# Patient Record
Sex: Female | Born: 1955 | Race: White | Hispanic: No | Marital: Married | Smoking: Never smoker
Health system: Southern US, Community
[De-identification: ages and names within clinical notes are randomized; demographics above are authoritative.]

## PROBLEM LIST (undated history)

## (undated) DIAGNOSIS — M1990 Unspecified osteoarthritis, unspecified site: Secondary | ICD-10-CM

## (undated) DIAGNOSIS — R87619 Unspecified abnormal cytological findings in specimens from cervix uteri: Secondary | ICD-10-CM

## (undated) DIAGNOSIS — J45909 Unspecified asthma, uncomplicated: Secondary | ICD-10-CM

## (undated) DIAGNOSIS — I10 Essential (primary) hypertension: Secondary | ICD-10-CM

## (undated) DIAGNOSIS — N979 Female infertility, unspecified: Secondary | ICD-10-CM

## (undated) DIAGNOSIS — IMO0002 Reserved for concepts with insufficient information to code with codable children: Secondary | ICD-10-CM

## (undated) HISTORY — DX: Essential (primary) hypertension: I10

## (undated) HISTORY — DX: Reserved for concepts with insufficient information to code with codable children: IMO0002

## (undated) HISTORY — DX: Female infertility, unspecified: N97.9

## (undated) HISTORY — DX: Unspecified abnormal cytological findings in specimens from cervix uteri: R87.619

## (undated) HISTORY — PX: OTHER SURGICAL HISTORY: SHX169

## (undated) HISTORY — PX: CRYOTHERAPY: SHX1416

## (undated) HISTORY — DX: Unspecified asthma, uncomplicated: J45.909

---

## 2001-07-23 ENCOUNTER — Encounter: Payer: Self-pay | Admitting: Family Medicine

## 2001-07-23 ENCOUNTER — Encounter: Admission: RE | Admit: 2001-07-23 | Discharge: 2001-07-23 | Payer: Self-pay | Admitting: Family Medicine

## 2003-03-14 ENCOUNTER — Encounter: Payer: Self-pay | Admitting: Obstetrics and Gynecology

## 2003-03-14 ENCOUNTER — Encounter: Admission: RE | Admit: 2003-03-14 | Discharge: 2003-03-14 | Payer: Self-pay | Admitting: Obstetrics and Gynecology

## 2004-03-05 ENCOUNTER — Other Ambulatory Visit: Admission: RE | Admit: 2004-03-05 | Discharge: 2004-03-05 | Payer: Self-pay | Admitting: Obstetrics and Gynecology

## 2004-04-12 ENCOUNTER — Ambulatory Visit (HOSPITAL_COMMUNITY): Admission: RE | Admit: 2004-04-12 | Discharge: 2004-04-12 | Payer: Self-pay | Admitting: Obstetrics and Gynecology

## 2005-04-03 ENCOUNTER — Other Ambulatory Visit: Admission: RE | Admit: 2005-04-03 | Discharge: 2005-04-03 | Payer: Self-pay | Admitting: Obstetrics and Gynecology

## 2005-05-12 ENCOUNTER — Ambulatory Visit (HOSPITAL_COMMUNITY): Admission: RE | Admit: 2005-05-12 | Discharge: 2005-05-12 | Payer: Self-pay | Admitting: Obstetrics and Gynecology

## 2006-04-29 ENCOUNTER — Other Ambulatory Visit: Admission: RE | Admit: 2006-04-29 | Discharge: 2006-04-29 | Payer: Self-pay | Admitting: Obstetrics and Gynecology

## 2006-06-16 ENCOUNTER — Ambulatory Visit (HOSPITAL_COMMUNITY): Admission: RE | Admit: 2006-06-16 | Discharge: 2006-06-16 | Payer: Self-pay | Admitting: Obstetrics and Gynecology

## 2006-06-26 ENCOUNTER — Encounter: Admission: RE | Admit: 2006-06-26 | Discharge: 2006-06-26 | Payer: Self-pay | Admitting: Obstetrics and Gynecology

## 2006-12-29 ENCOUNTER — Encounter: Admission: RE | Admit: 2006-12-29 | Discharge: 2006-12-29 | Payer: Self-pay | Admitting: Obstetrics and Gynecology

## 2007-05-18 ENCOUNTER — Other Ambulatory Visit: Admission: RE | Admit: 2007-05-18 | Discharge: 2007-05-18 | Payer: Self-pay | Admitting: Obstetrics and Gynecology

## 2007-08-03 ENCOUNTER — Encounter: Admission: RE | Admit: 2007-08-03 | Discharge: 2007-08-03 | Payer: Self-pay | Admitting: Obstetrics and Gynecology

## 2008-07-14 ENCOUNTER — Other Ambulatory Visit: Admission: RE | Admit: 2008-07-14 | Discharge: 2008-07-14 | Payer: Self-pay | Admitting: Obstetrics and Gynecology

## 2008-08-03 ENCOUNTER — Ambulatory Visit (HOSPITAL_COMMUNITY): Admission: RE | Admit: 2008-08-03 | Discharge: 2008-08-03 | Payer: Self-pay | Admitting: Obstetrics and Gynecology

## 2008-08-08 ENCOUNTER — Encounter: Admission: RE | Admit: 2008-08-08 | Discharge: 2008-08-08 | Payer: Self-pay | Admitting: Obstetrics and Gynecology

## 2009-08-24 ENCOUNTER — Ambulatory Visit (HOSPITAL_COMMUNITY): Admission: RE | Admit: 2009-08-24 | Discharge: 2009-08-24 | Payer: Self-pay | Admitting: Family Medicine

## 2009-09-22 IMAGING — MG MM DIAGNOSTIC LTD LEFT
5 series · 5 of 5 positions shown · non-contrast
Comparison: 08/03/2007, 12/29/2006 (left), 06/26/2006, 06/16/2006

CLINICAL DATA: The patient returns for evaluation of calcifications
in the left breast noted on recent screening study dated
08/03/2008.

[REDACTED] MAMMOGRAM WITH CAD

[L MLO]
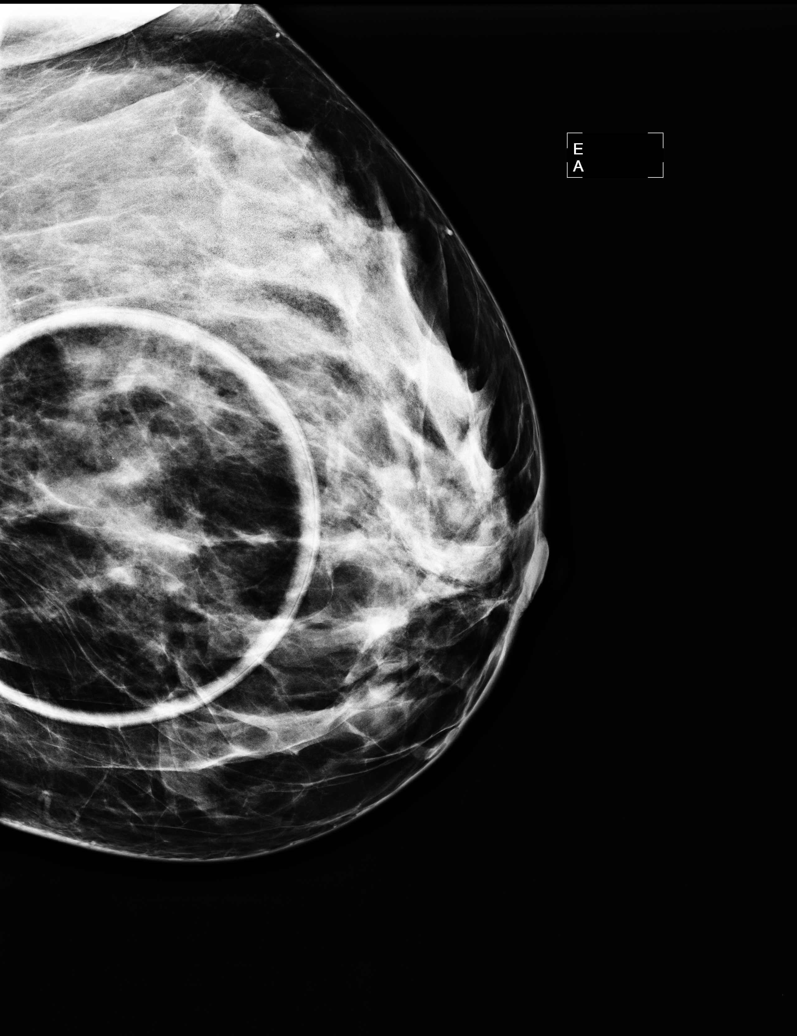

[L ML (1 of 3)]
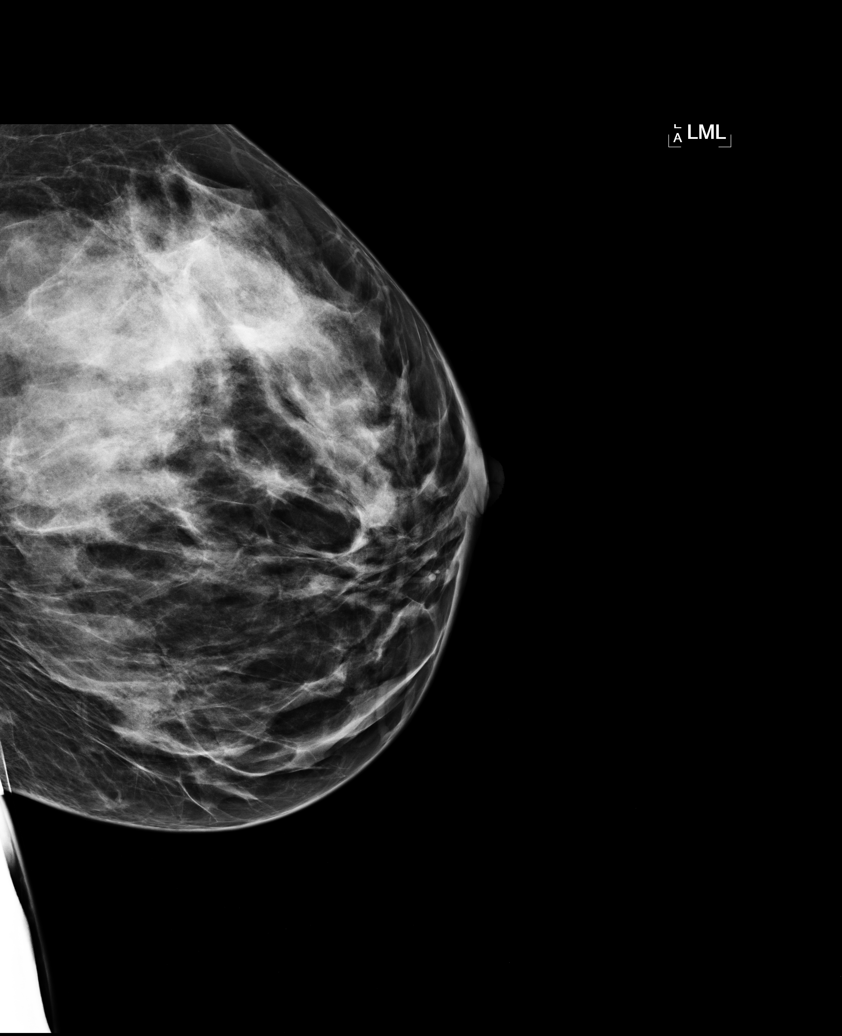

[L CC]
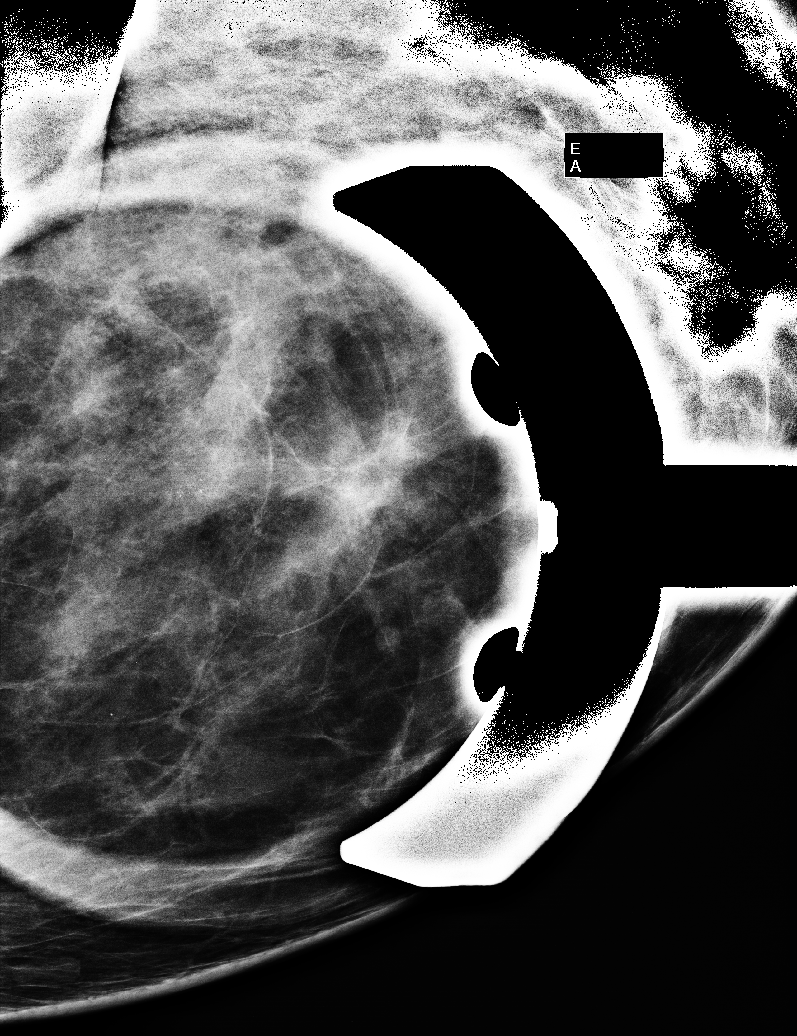

[L ML (2 of 3)]
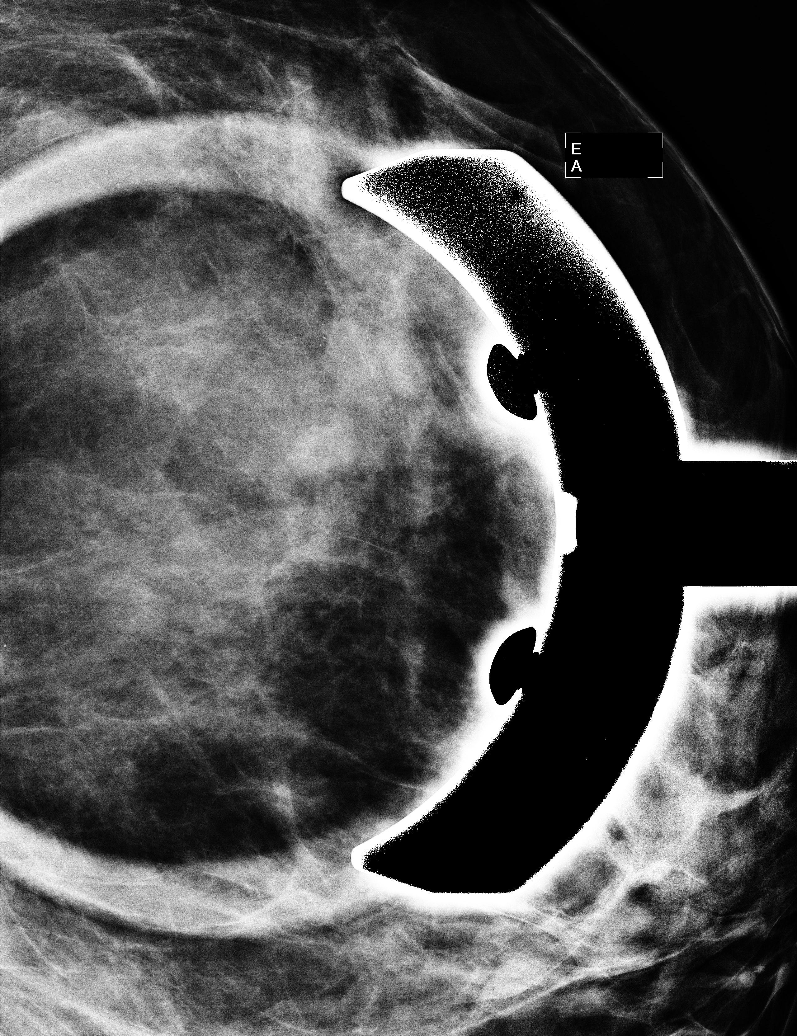

[L ML (3 of 3)]
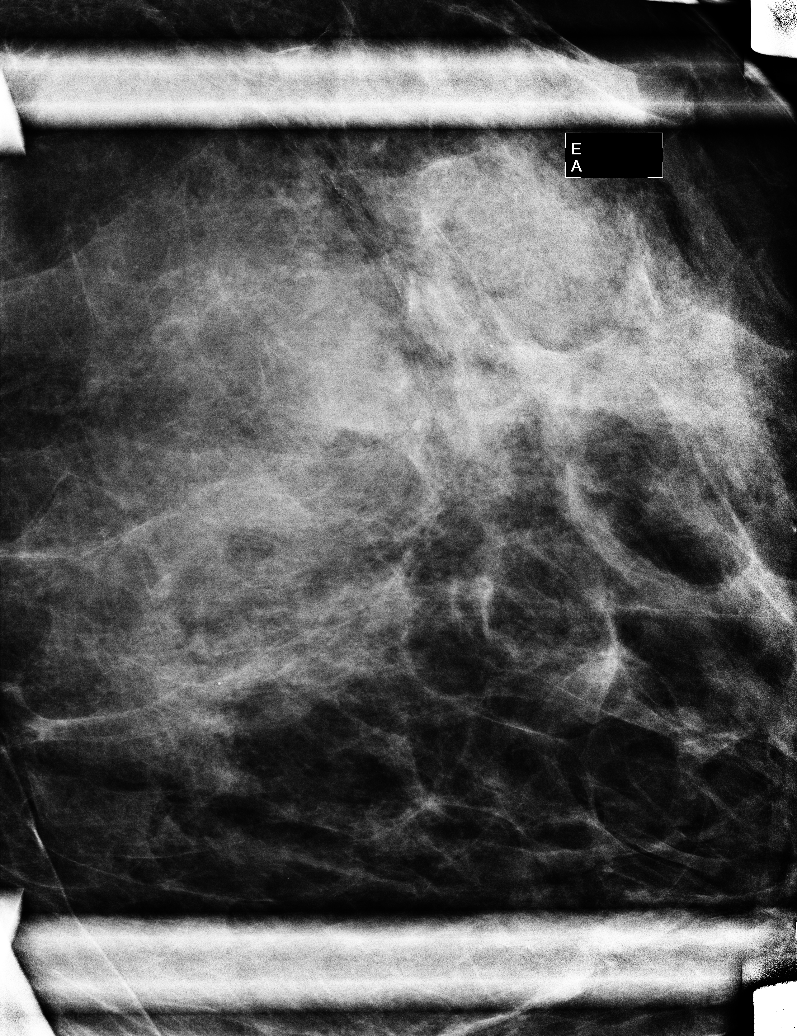

[5 of 5 positions shown; findings below may reference images not displayed]

FINDINGS: Magnification views demonstrate no change in the
punctate calcifications in the left upper inner quadrant.  These
are felt to have a benign appearance.
IMPRESSION: No mammographic evidence of malignancy.  Yearly screening
mammography is suggested.

BI-RADS CATEGORY 2:  Benign finding(s).

## 2010-10-10 ENCOUNTER — Ambulatory Visit (HOSPITAL_COMMUNITY): Admission: RE | Admit: 2010-10-10 | Discharge: 2010-10-10 | Payer: Self-pay | Admitting: Family Medicine

## 2010-10-29 ENCOUNTER — Encounter
Admission: RE | Admit: 2010-10-29 | Discharge: 2010-10-29 | Payer: Self-pay | Source: Home / Self Care | Attending: Family Medicine | Admitting: Family Medicine

## 2010-12-01 ENCOUNTER — Encounter: Payer: Self-pay | Admitting: Family Medicine

## 2010-12-01 ENCOUNTER — Encounter: Payer: Self-pay | Admitting: Obstetrics and Gynecology

## 2012-03-15 ENCOUNTER — Other Ambulatory Visit: Payer: Self-pay | Admitting: Family Medicine

## 2012-03-15 DIAGNOSIS — Z1231 Encounter for screening mammogram for malignant neoplasm of breast: Secondary | ICD-10-CM

## 2012-05-03 ENCOUNTER — Ambulatory Visit
Admission: RE | Admit: 2012-05-03 | Discharge: 2012-05-03 | Disposition: A | Discharge status: Home / Self Care | Payer: BC Managed Care – PPO | Source: Ambulatory Visit | Attending: Family Medicine | Admitting: Family Medicine

## 2012-05-03 DIAGNOSIS — Z1231 Encounter for screening mammogram for malignant neoplasm of breast: Secondary | ICD-10-CM

## 2013-05-18 ENCOUNTER — Encounter: Payer: Self-pay | Admitting: Obstetrics and Gynecology

## 2013-08-22 ENCOUNTER — Encounter: Payer: Self-pay | Admitting: Gynecology

## 2013-09-09 ENCOUNTER — Encounter: Payer: Self-pay | Admitting: Gynecology

## 2013-09-09 ENCOUNTER — Ambulatory Visit (INDEPENDENT_AMBULATORY_CARE_PROVIDER_SITE_OTHER): Payer: BC Managed Care – PPO | Admitting: Gynecology

## 2013-09-09 VITALS — BP 112/68 | HR 60 | Resp 16 | Ht 63.25 in | Wt 162.0 lb

## 2013-09-09 DIAGNOSIS — Z124 Encounter for screening for malignant neoplasm of cervix: Secondary | ICD-10-CM

## 2013-09-09 DIAGNOSIS — Z0000 Encounter for general adult medical examination without abnormal findings: Secondary | ICD-10-CM

## 2013-09-09 DIAGNOSIS — Z01419 Encounter for gynecological examination (general) (routine) without abnormal findings: Secondary | ICD-10-CM

## 2013-09-09 DIAGNOSIS — I10 Essential (primary) hypertension: Secondary | ICD-10-CM | POA: Insufficient documentation

## 2013-09-09 DIAGNOSIS — N920 Excessive and frequent menstruation with regular cycle: Secondary | ICD-10-CM

## 2013-09-09 LAB — POCT URINALYSIS DIPSTICK
Bilirubin, UA: NEGATIVE
Ketones, UA: NEGATIVE
Leukocytes, UA: NEGATIVE
Nitrite, UA: NEGATIVE
Urobilinogen, UA: NEGATIVE

## 2013-09-09 NOTE — Patient Instructions (Signed)

## 2013-09-09 NOTE — Progress Notes (Signed)
57 y.o. married Caucasian female   [id number] here for annual exam. Pt reports menses are not regular.  She does not know report hot flashes, does not have night sweats, does not have vaginal dryness.  She is not using lubricants.  She does not report post-menopasual bleeding.  Pt reports having cycles, irregular for 74m, reports as heavy doubles tampons every 2h for 1-2d, then lasts 2-3d more but lighter.  Pt reports some clots grape size, no post-coital bleeding. Was evaluated with [facility] several years ago, unsure if she had EMB Pt with abnormal PAP 35y ago, treated with cryo.  Patient's last menstrual period was 08/06/2013.          Sexually active: yes  The current method of family planning is vasectomy.    Exercising: yes  yoga Last pap: early 2013 Abnormal PAP: had cryo done 20-25 yrs ago Mammogram: 6/13 BSE: not done Colonoscopy: 2013 DEXA: none Alcohol: 4-5 Tobacco: none  No health maintenance topics applied.  Family History  Problem Relation Age of Onset  . Cancer Mother     lung  . Hypertension Mother   . Diabetes Brother   . Heart disease Brother   . Cancer Other     leg  . Cancer Brother     lung    There are no active problems to display for this patient.   Past Medical History  Diagnosis Date  . Hypertension   . Infertility, female     Past Surgical History  Procedure Laterality Date  . Vaginal wall repair      after delivery    Allergies: Review of patient's allergies indicates no known allergies.  Current Outpatient Prescriptions  Medication Sig Dispense Refill  . lisinopril-hydrochlorothiazide (PRINZIDE,ZESTORETIC) 10-12.5 MG per tablet Take 1 tablet by mouth daily.      . Multiple Vitamins-Minerals (MULTIVITAMIN PO) Take by mouth as needed.       No current facility-administered medications for this visit.    ROS: Pertinent items are noted in HPI.  Exam:    BP 112/68  Pulse 60  Resp 16  Ht 5' 3.25" (1.607 m)  Wt 162 lb (73.483 kg)  BMI 28.45  kg/m2  LMP 08/06/2013 Weight change: @WEIGHTCHANGE @ Last 3 height recordings:  Ht Readings from Last 3 Encounters:  09/09/13 5' 3.25" (1.607 m)   General appearance: alert, cooperative and appears stated age Head: Normocephalic, without obvious abnormality, atraumatic Neck: no adenopathy, no carotid bruit, no JVD, supple, symmetrical, trachea midline and thyroid not enlarged, symmetric, no tenderness/mass/nodules Lungs: clear to auscultation bilaterally Breasts: normal appearance, no masses or tenderness Heart: regular rate and rhythm, S1, S2 normal, no murmur, click, rub or gallop Abdomen: soft, non-tender; bowel sounds normal; no masses,  no organomegaly Extremities: extremities normal, atraumatic, no cyanosis or edema Skin: Skin color, texture, turgor normal. No rashes or lesions Lymph nodes: Cervical, supraclavicular, and axillary nodes normal. no inguinal nodes palpated Neurologic: Grossly normal   Pelvic: External genitalia:  no lesions              Urethra: normal appearing urethra with no masses, tenderness or lesions              Bartholins and Skenes: normal                 Vagina: normal appearing vagina with normal color and discharge, no lesions              Cervix: normal appearance and nabothian cysts, small amount  of blood at os              Pap taken: yes        Bimanual Exam:  Uterus:  uterus is normal size, shape, consistency and nontender                                      Adnexa:    normal adnexa in size, nontender and no masses                                      Rectovaginal: Confirms                                      Anus:  normal sphincter tone, no lesions  A: well woman Peri-menopause with menorrhagia     P: mammogram pap smear with HPV counseled on breast self exam, mammography screening, adequate intake of calcium and vitamin D, diet and exercise return annually or prn Discussed PAP guideline changes, importance of weight bearing exercises,  calcium, vit D and balanced diet.  An After Visit Summary was printed and given to the patient.

## 2013-09-12 ENCOUNTER — Telehealth: Payer: Self-pay | Admitting: Gynecology

## 2013-09-12 ENCOUNTER — Telehealth: Payer: Self-pay | Admitting: *Deleted

## 2013-09-12 NOTE — Telephone Encounter (Signed)
LMTCB to discuss ins benefits and schedule [facility]o Bx.

## 2013-09-12 NOTE — Telephone Encounter (Signed)
Message copied by Lorraine Lax on Mon Sep 12, 2013 12:35 PM ------      Message from: Douglass Rivers      Created: Mon Sep 12, 2013 10:50 AM       Please inform the patient. Labs normal       ------

## 2013-09-12 NOTE — Telephone Encounter (Signed)
Left Message To Call Back  

## 2013-09-14 NOTE — Telephone Encounter (Signed)
LMTCB to schedule PUS.

## 2013-09-14 NOTE — Telephone Encounter (Signed)
Patient notified 09/12/13 (see labs)

## 2013-10-04 ENCOUNTER — Ambulatory Visit (INDEPENDENT_AMBULATORY_CARE_PROVIDER_SITE_OTHER): Payer: BC Managed Care – PPO | Admitting: Gynecology

## 2013-10-04 ENCOUNTER — Encounter: Payer: Self-pay | Admitting: Gynecology

## 2013-10-04 ENCOUNTER — Other Ambulatory Visit: Payer: Self-pay | Admitting: Gynecology

## 2013-10-04 ENCOUNTER — Ambulatory Visit (INDEPENDENT_AMBULATORY_CARE_PROVIDER_SITE_OTHER): Payer: BC Managed Care – PPO

## 2013-10-04 VITALS — BP 122/80 | HR 60 | Resp 12 | Ht 63.25 in | Wt 163.0 lb

## 2013-10-04 DIAGNOSIS — N9489 Other specified conditions associated with female genital organs and menstrual cycle: Secondary | ICD-10-CM

## 2013-10-04 DIAGNOSIS — N840 Polyp of corpus uteri: Secondary | ICD-10-CM

## 2013-10-04 DIAGNOSIS — N920 Excessive and frequent menstruation with regular cycle: Secondary | ICD-10-CM

## 2013-10-04 DIAGNOSIS — R9389 Abnormal findings on diagnostic imaging of other specified body structures: Secondary | ICD-10-CM

## 2013-10-04 NOTE — Progress Notes (Signed)
    Pt here for u/s and sonohysterogram for menorrhagia.  Pt reports having cycles, irregular for 27m, reports as heavy doubles tampons every 2h for 1-2d, then lasts 2-3d more but lighter. Pt reports some clots grape size. U/s images reviewed, retroverted uterus with asymmetrical endometrial stripe noted. Recommend sonohysterogram, consent obtained. Speculum placed, cervix cleansed with betadine, insemination catheter placed and walls gently distended.  endometrial defect noted with feeder vessel on posterior wall-1.2x.7cm and a second smaller defect noted. Reviewed findings. Recommend hysteroscopy with tru clear. Risks of bleeding, perforation, infection were reviewed.  Possible DVT formation and subsequent PE discussed.  Possible dx laparoscopy if perforation occurs discussed. Expect that surgery will be both diagnostic and therapeutic re her bleeding. Questions addressed Pt is comfortable with the decision to treat.   BP 122/80  Pulse 60  Resp 12  Ht 5' 3.25" (1.607 m)  Wt 163 lb (73.936 kg)  BMI 28.63 kg/m2  LMP 09/10/2013 General appearance: alert, cooperative and appears stated age Lungs: clear to auscultation bilaterally Heart: regular rate and rhythm, S1, S2 normal, no murmur, click, rub or gallop Abdomen: soft nontender Pelvic exam: normal external genitalia, vulva, vagina, cervix, uterus and adnexa, retroverted uterus.  62m spent discussing treatment for menorrhagia and endometrial polyps

## 2013-10-05 ENCOUNTER — Telehealth: Payer: Self-pay | Admitting: *Deleted

## 2013-10-05 NOTE — Telephone Encounter (Signed)
Call to patient regarding possible surgery date.  Advised only December option would be 11-01-13.  Patient states this date will not work for her.  Would prefer to look at [facility], poss 11-16-12 but will check her school schedule and call me back next week.  Routing to provider for final review. Patient agreeable to disposition. Will close encounter

## 2013-10-12 NOTE — Telephone Encounter (Signed)
Patient is returning a call to carolynn.

## 2013-10-12 NOTE — Telephone Encounter (Signed)
Spoke with patient in regards to her insurance benefits for surgery. Patient asked if I would e-mail her the information so she can see it in front of her. She will call back within 24 hours with her decision. She is currently still available on 11/16/13.

## 2013-10-12 NOTE — Telephone Encounter (Signed)
LMTCB to discuss insurance benefits for surgery.  °

## 2013-10-14 NOTE — Telephone Encounter (Signed)
Patient is asking to talk with Carolynn.

## 2013-10-19 NOTE — Telephone Encounter (Signed)
Patient called to check on status of having surgery scheduled. Advised that Candace [name] was unavailable but I would relay the message.

## 2013-10-20 NOTE — Telephone Encounter (Signed)
Surgery scheduled for 11-16-13 at 0900 at [facility].  Call to patient, LMTCB.

## 2013-10-26 NOTE — Telephone Encounter (Signed)
Patient notified of surgery date of 11-16-13 and preop instructions given and will mail copy.  Pre/post op appt scheduled. Brief discussion on 2-3 days off work. Patient states she does not need a note. Inst to call prn.  Routing to provider for final review. Patient agreeable to disposition. Will close encounter

## 2013-10-31 ENCOUNTER — Encounter (HOSPITAL_COMMUNITY): Payer: Self-pay | Admitting: Pharmacist

## 2013-11-01 ENCOUNTER — Encounter: Payer: Self-pay | Admitting: Gynecology

## 2013-11-01 ENCOUNTER — Ambulatory Visit (INDEPENDENT_AMBULATORY_CARE_PROVIDER_SITE_OTHER): Payer: BC Managed Care – PPO | Admitting: Gynecology

## 2013-11-01 VITALS — BP 104/78 | HR 80 | Resp 12 | Ht 63.25 in | Wt 163.0 lb

## 2013-11-01 DIAGNOSIS — N840 Polyp of corpus uteri: Secondary | ICD-10-CM

## 2013-11-01 DIAGNOSIS — N920 Excessive and frequent menstruation with regular cycle: Secondary | ICD-10-CM

## 2013-11-01 MED ORDER — MISOPROSTOL 200 MCG PO TABS
ORAL_TABLET | ORAL | Status: DC
Start: 2013-11-01 — End: 2013-11-16

## 2013-11-01 NOTE — Progress Notes (Signed)
  57 y.o.MarriedNot Hispanic or Latinofemale presents for preoperative consult for D&C hysteroscopy with tru clear formenorrhagia, metrorrhagia and endometrial polyps diagnosed by [facility] 10/04/13- 12mm in left fundal area and a second one on right.  Pt is without complaints today   Pertinent items are noted in HPI.  BP 104/78  Pulse 80  Resp 12  Ht 5' 3.25" (1.607 m)  Wt 163 lb (73.936 kg)  BMI 28.63 kg/m2  LMP 09/10/2013 General appearance: alert, cooperative and appears stated age Lungs: clear to auscultation bilaterally Heart: regular rate and rhythm, S1, S2 normal, no murmur, click, rub or gallop Abdomen: soft, non-tender; bowel sounds normal; no masses,  no organomegaly Pelvic: cervix normal in appearance, external genitalia normal, no adnexal masses or tenderness, no cervical motion tenderness, uterus normal size, shape, and consistency, vagina normal without discharge and anteverted Extremities: no edema  The procedure was discussed at length. Pt informed regarding risks and benefits of surgery, including but not limited to bleeding, infections, damage to bowel or bladder due to uterine perforation either during the procedure or during dilation. Nerve injury was also discussed related to positioning. A medicationwill be be given preoperatively to soften the cervix. Pt aware of posssible laparoscopy if uterine perforation occurs  There is a risk of formation of a deep vein thrombus in the extremities was discussed, although PAS will be placed to minimize the risk, the patient was informed that a pulmonary embolism could still form and could result in death.  She was instructed on signs and symptoms to be aware of and the need to call if they should develop.  Fluid overload from the distending media was discussed as was the usual intra-operative safety precautions in place. Pt aware distending media is normal saline.    All questions were addressed.   Pt ready for surgery  Length of  consult 25m, >50% face to face 

## 2013-11-14 ENCOUNTER — Encounter (HOSPITAL_COMMUNITY): Payer: Self-pay

## 2013-11-14 ENCOUNTER — Encounter (HOSPITAL_COMMUNITY)
Admission: RE | Admit: 2013-11-14 | Discharge: 2013-11-14 | Disposition: A | Discharge status: Home / Self Care | Payer: BC Managed Care – PPO | Source: Ambulatory Visit | Attending: Gynecology | Admitting: Gynecology

## 2013-11-14 HISTORY — DX: Unspecified osteoarthritis, unspecified site: M19.90

## 2013-11-14 LAB — CBC
HEMATOCRIT: 37.9 % (ref 36.0–46.0)
HEMOGLOBIN: 13 g/dL (ref 12.0–15.0)
MCH: 31.6 pg (ref 26.0–34.0)
MCHC: 34.3 g/dL (ref 30.0–36.0)
MCV: 92.2 fL (ref 78.0–100.0)
Platelets: 270 10*3/uL (ref 150–400)
RBC: 4.11 MIL/uL (ref 3.87–5.11)
RDW: 12.3 % (ref 11.5–15.5)
WBC: 5.2 10*3/uL (ref 4.0–10.5)

## 2013-11-14 LAB — COMPREHENSIVE METABOLIC PANEL
ALT: 9 U/L (ref 0–35)
AST: 19 U/L (ref 0–37)
Albumin: 4 g/dL (ref 3.5–5.2)
Alkaline Phosphatase: 68 U/L (ref 39–117)
BUN: 18 mg/dL (ref 6–23)
CALCIUM: 9.3 mg/dL (ref 8.4–10.5)
CO2: 26 meq/L (ref 19–32)
CREATININE: 0.72 mg/dL (ref 0.50–1.10)
Chloride: 98 mEq/L (ref 96–112)
GFR calc non Af Amer: >90 mL/min (ref >90)
Glucose, Bld: 89 mg/dL (ref 70–99)
POTASSIUM: 3.7 meq/L (ref 3.7–5.3)
SODIUM: 137 meq/L (ref 137–147)
Total Bilirubin: <0.2 mg/dL — ABNORMAL LOW (ref 0.3–1.2)
Total Protein: 7.8 g/dL (ref 6.0–8.3)

## 2013-11-14 NOTE — Patient Instructions (Addendum)
   Your procedure is scheduled on: Wed, Jan 7  [facility] 8 AM Pick up the phone at the desk and dial [phone] and inform us of your arrival.  Please call this number if you have any problems the morning of surgery: [phone]  Remember: Do not eat or drink after midnight: Tuesday Take these medicines the morning of surgery with a SIP OF WATER: lisinopril-hydrochlorothiazide.  Patient instructed to take cytotec on Tuesday night as instructed by MD   Do not wear jewelry, make-up, or FINGER nail polish No metal in your hair or on your body. Do not wear lotions, powders, perfumes. You may wear deodorant.  Do not bring valuables to the hospital. Contacts, dentures or bridgework may not be worn into surgery.  Patients discharged on the day of surgery will not be allowed to drive home.  Home with Home with husband Scott cell [phone].

## 2013-11-16 ENCOUNTER — Ambulatory Visit (HOSPITAL_COMMUNITY)
Admission: RE | Admit: 2013-11-16 | Discharge: 2013-11-16 | Disposition: A | Discharge status: Home / Self Care | Payer: BC Managed Care – PPO | Source: Ambulatory Visit | Attending: Gynecology | Admitting: Gynecology

## 2013-11-16 ENCOUNTER — Encounter (HOSPITAL_COMMUNITY)
Admission: RE | Disposition: A | Discharge status: Home / Self Care | Payer: Self-pay | Source: Ambulatory Visit | Attending: Gynecology

## 2013-11-16 ENCOUNTER — Ambulatory Visit (HOSPITAL_COMMUNITY): Payer: BC Managed Care – PPO | Admitting: Anesthesiology

## 2013-11-16 ENCOUNTER — Encounter (HOSPITAL_COMMUNITY): Payer: BC Managed Care – PPO | Admitting: Anesthesiology

## 2013-11-16 ENCOUNTER — Encounter (HOSPITAL_COMMUNITY): Payer: Self-pay | Admitting: Anesthesiology

## 2013-11-16 DIAGNOSIS — N920 Excessive and frequent menstruation with regular cycle: Secondary | ICD-10-CM | POA: Insufficient documentation

## 2013-11-16 DIAGNOSIS — I10 Essential (primary) hypertension: Secondary | ICD-10-CM

## 2013-11-16 DIAGNOSIS — Z9889 Other specified postprocedural states: Secondary | ICD-10-CM

## 2013-11-16 DIAGNOSIS — N921 Excessive and frequent menstruation with irregular cycle: Secondary | ICD-10-CM | POA: Insufficient documentation

## 2013-11-16 DIAGNOSIS — N840 Polyp of corpus uteri: Secondary | ICD-10-CM | POA: Insufficient documentation

## 2013-11-16 HISTORY — PX: DILATATION & CURETTAGE/HYSTEROSCOPY WITH TRUECLEAR: SHX6353

## 2013-11-16 SURGERY — DILATATION & CURETTAGE/HYSTEROSCOPY WITH TRUCLEAR
Anesthesia: General | Site: Uterus

## 2013-11-16 MED ORDER — ONDANSETRON HCL 4 MG/2ML IJ SOLN: 4.0000 mg | Freq: Once | INTRAMUSCULAR | Status: DC | PRN

## 2013-11-16 MED ORDER — BUPIVACAINE-EPINEPHRINE 0.25% -1:200000 IJ SOLN
INTRAMUSCULAR | Status: DC | PRN
  Administered 2013-11-16: 6 mL

## 2013-11-16 MED ORDER — LIDOCAINE HCL (CARDIAC) 20 MG/ML IV SOLN
INTRAVENOUS | Status: AC
  Filled 2013-11-16: qty 5

## 2013-11-16 MED ORDER — LIDOCAINE-EPINEPHRINE 2 %-1:100000 IJ SOLN
INTRAMUSCULAR | Status: AC
  Filled 2013-11-16: qty 1

## 2013-11-16 MED ORDER — ONDANSETRON HCL 4 MG/2ML IJ SOLN
INTRAMUSCULAR | Status: AC
  Filled 2013-11-16: qty 2

## 2013-11-16 MED ORDER — BUPIVACAINE-EPINEPHRINE PF 0.25-1:200000 % IJ SOLN
INTRAMUSCULAR | Status: AC
  Filled 2013-11-16: qty 30

## 2013-11-16 MED ORDER — LIDOCAINE HCL 2 % IJ SOLN
INTRAMUSCULAR | Status: DC | PRN
  Administered 2013-11-16: 6 mL

## 2013-11-16 MED ORDER — LIDOCAINE HCL 2 % IJ SOLN
INTRAMUSCULAR | Status: AC
  Filled 2013-11-16: qty 20

## 2013-11-16 MED ORDER — MIDAZOLAM HCL 2 MG/2ML IJ SOLN
INTRAMUSCULAR | Status: AC
  Filled 2013-11-16: qty 2

## 2013-11-16 MED ORDER — KETOROLAC TROMETHAMINE 30 MG/ML IJ SOLN: 15.0000 mg | Freq: Once | INTRAMUSCULAR | Status: DC | PRN

## 2013-11-16 MED ORDER — FENTANYL CITRATE 0.05 MG/ML IJ SOLN
INTRAMUSCULAR | Status: AC
  Filled 2013-11-16: qty 5

## 2013-11-16 MED ORDER — LIDOCAINE HCL 2 % EX GEL
CUTANEOUS | Status: AC
  Filled 2013-11-16: qty 5

## 2013-11-16 MED ORDER — FENTANYL CITRATE 0.05 MG/ML IJ SOLN
INTRAMUSCULAR | Status: DC | PRN
  Administered 2013-11-16 (×2): 50 ug via INTRAVENOUS

## 2013-11-16 MED ORDER — KETOROLAC TROMETHAMINE 30 MG/ML IJ SOLN
INTRAMUSCULAR | Status: DC | PRN
  Administered 2013-11-16: 30 mg via INTRAVENOUS

## 2013-11-16 MED ORDER — PROPOFOL 10 MG/ML IV EMUL
INTRAVENOUS | Status: AC
  Filled 2013-11-16: qty 20

## 2013-11-16 MED ORDER — MEPERIDINE HCL 25 MG/ML IJ SOLN: 6.2500 mg | INTRAMUSCULAR | Status: DC | PRN

## 2013-11-16 MED ORDER — MIDAZOLAM HCL 2 MG/2ML IJ SOLN
INTRAMUSCULAR | Status: DC | PRN
  Administered 2013-11-16: 2 mg via INTRAVENOUS

## 2013-11-16 MED ORDER — LACTATED RINGERS IV SOLN
INTRAVENOUS | Status: DC | PRN
  Administered 2013-11-16 (×2): via INTRAVENOUS

## 2013-11-16 MED ORDER — ONDANSETRON HCL 4 MG/2ML IJ SOLN
INTRAMUSCULAR | Status: DC | PRN
Start: 2013-11-16 — End: 2013-11-16
  Administered 2013-11-16: 4 mg via INTRAVENOUS

## 2013-11-16 MED ORDER — PHENYLEPHRINE HCL 10 MG/ML IJ SOLN
INTRAMUSCULAR | Status: DC | PRN
  Administered 2013-11-16 (×5): 40 ug via INTRAVENOUS

## 2013-11-16 MED ORDER — KETOROLAC TROMETHAMINE 30 MG/ML IJ SOLN
INTRAMUSCULAR | Status: AC
  Filled 2013-11-16: qty 1

## 2013-11-16 MED ORDER — DEXAMETHASONE SODIUM PHOSPHATE 10 MG/ML IJ SOLN
INTRAMUSCULAR | Status: AC
  Filled 2013-11-16: qty 1

## 2013-11-16 MED ORDER — LACTATED RINGERS IV SOLN
INTRAVENOUS | Status: DC
  Administered 2013-11-16: 09:00:00 via INTRAVENOUS

## 2013-11-16 MED ORDER — FENTANYL CITRATE 0.05 MG/ML IJ SOLN: 25.0000 ug | INTRAMUSCULAR | Status: DC | PRN

## 2013-11-16 MED ORDER — DEXAMETHASONE SODIUM PHOSPHATE 4 MG/ML IJ SOLN
INTRAMUSCULAR | Status: DC | PRN
  Administered 2013-11-16: 10 mg via INTRAVENOUS

## 2013-11-16 MED ORDER — LIDOCAINE HCL (CARDIAC) 20 MG/ML IV SOLN
INTRAVENOUS | Status: DC | PRN
  Administered 2013-11-16: 50 mg via INTRAVENOUS

## 2013-11-16 MED ORDER — PROPOFOL 10 MG/ML IV BOLUS
INTRAVENOUS | Status: DC | PRN
  Administered 2013-11-16: 160 mg via INTRAVENOUS

## 2013-11-16 SURGICAL SUPPLY — 21 items
BLADE INCISOR TRUC PLUS 2.9 (ABLATOR) IMPLANT
CANISTERS HI-FLOW 3000CC (CANNISTER) ×4 IMPLANT
CATH ROBINSON RED A/P 16FR (CATHETERS) ×2 IMPLANT
CONTAINER PREFILL 10% NBF 60ML (FORM) ×4 IMPLANT
DRAPE HYSTEROSCOPY (DRAPE) IMPLANT
DRSG TELFA 3X8 NADH (GAUZE/BANDAGES/DRESSINGS) ×2 IMPLANT
GLOVE BIOGEL M 6.5 STRL (GLOVE) ×2 IMPLANT
GLOVE BIOGEL PI IND STRL 6.5 (GLOVE) ×1 IMPLANT
GLOVE BIOGEL PI INDICATOR 6.5 (GLOVE) ×1
GOWN STRL REIN XL XLG (GOWN DISPOSABLE) ×4 IMPLANT
INCISOR TRUC PLUS BLADE 2.9 (ABLATOR) ×2
KIT HYSTEROSCOPY TRUCLEAR (ABLATOR) IMPLANT
MORCELLATOR RECIP TRUCLEAR 4.0 (ABLATOR) IMPLANT
NDL SPNL 22GX3.5 QUINCKE BK (NEEDLE) ×1 IMPLANT
NEEDLE SPNL 22GX3.5 QUINCKE BK (NEEDLE) ×2 IMPLANT
PACK VAGINAL MINOR WOMEN LF (CUSTOM PROCEDURE TRAY) ×2 IMPLANT
PAD DRESSING TELFA 3X8 NADH (GAUZE/BANDAGES/DRESSINGS) ×1 IMPLANT
PAD OB MATERNITY 4.3X12.25 (PERSONAL CARE ITEMS) ×2 IMPLANT
SYRINGE 10CC LL (SYRINGE) ×2 IMPLANT
TOWEL OR 17X24 6PK STRL BLUE (TOWEL DISPOSABLE) ×4 IMPLANT
WATER STERILE IRR 1000ML POUR (IV SOLUTION) ×2 IMPLANT

## 2013-11-16 NOTE — Anesthesia Postprocedure Evaluation (Signed)
Anesthesia Post Note  Patient: Candace HinesElizabeth A Galambos  Procedure(s) Performed: Procedure(s) (LRB): DILATATION & CURETTAGE/HYSTEROSCOPY WITH TRUCLEAR (N/A)  Anesthesia type: General  Patient location: PACU  Post pain: Pain level controlled  Post assessment: Post-op Vital signs reviewed  Last Vitals:  Filed Vitals:   11/16/13 1045  BP: 104/62  Pulse: 64  Temp:   Resp: 16    Post vital signs: Reviewed  Level of consciousness: sedated  Complications: No apparent anesthesia complications

## 2013-11-16 NOTE — Transfer of Care (Signed)
Immediate Anesthesia Transfer of Care Note  Patient: Candace HinesElizabeth A Mergenthaler  Procedure(s) Performed: Procedure(s): DILATATION & CURETTAGE/HYSTEROSCOPY WITH TRUCLEAR (N/A)  Patient Location: PACU  Anesthesia Type:General  Level of Consciousness: awake, alert  and patient cooperative  Airway & Oxygen Therapy: Patient Spontanous Breathing and Patient connected to nasal cannula oxygen  Post-op Assessment: Report given to PACU RN and Post -op Vital signs reviewed and stable  Post vital signs: Reviewed and stable  Complications: No apparent anesthesia complications

## 2013-11-16 NOTE — H&P (View-Only) (Signed)
  58 y.o.MarriedNot Hispanic or Latinofemale [facility] clear formenorrhagia, metrorrhagia and [facility]- 12mm in left fundal area and a second one on right.  Pt is without complaints today   Pertinent items are noted in HPI.  BP 104/78  Pulse 80  Resp 12  Ht 5' 3.25" (1.607 m)  Wt 163 lb (73.936 kg)  BMI 28.63 kg/m2  LMP 09/10/2013 General appearance: alert, cooperative and appears stated age Lungs: clear to auscultation bilaterally Heart: regular rate and rhythm, S1, S2 normal, no murmur, click, rub or gallop Abdomen: soft, non-tender; bowel sounds normal; no masses,  no organomegaly Pelvic: cervix normal in appearance, external genitalia normal, no adnexal masses or tenderness, no cervical motion tenderness, uterus normal size, shape, and consistency, vagina normal without discharge and anteverted Extremities: no edema  The procedure was discussed at length. Pt informed regarding risks and benefits of surgery, including but not limited to bleeding, infections, damage to bowel or bladder due to uterine perforation either during the procedure or during dilation. Nerve injury was also discussed related to positioning. A medicationwill be be given preoperatively to soften the cervix. Pt aware of posssible laparoscopy if uterine perforation occurs  There is a risk of formation of a deep vein thrombus in the extremities was discussed, although PAS will be placed to minimize the risk, the patient was informed that a pulmonary embolism could still form and could result in death.  She was instructed on signs and symptoms to be aware of and the need to call if they should develop.  Fluid overload from the distending media was discussed as was the usual intra-operative safety precautions in place. Pt aware distending media is normal saline.    All questions were addressed.   Pt ready for surgery  Length of  consult 2568m, >50% face to face

## 2013-11-16 NOTE — Discharge Instructions (Signed)

## 2013-11-16 NOTE — Brief Op Note (Signed)
11/16/2013  10:26 AM  PATIENT:  Candace HinesElizabeth A [name]  58 y.o. female  PRE-OPERATIVE DIAGNOSIS:  menorrhagia, endometrial polyp  POST-OPERATIVE DIAGNOSIS:  menorrhagia, endometrial polyp  PROCEDURE:  Procedure(s): DILATATION & CURETTAGE/HYSTEROSCOPY WITH TRUCLEAR (N/A)  SURGEON:  Surgeon(s) and Role:    * Bennye Almracy H Andrius Andrepont, MD - Primary  PHYSICIAN ASSISTANT:   ASSISTANTS: none   ANESTHESIA:   MAC  EBL:  Total I/O In: 1300 [I.V.:1300] Out: 50 [Urine:40; Blood:10]  BLOOD ADMINISTERED:none  DRAINS: none   LOCAL MEDICATIONS USED:  0.25% MARCAINE   , 2%LIDOCAINE  and Amount: 13 ml  SPECIMEN:  Source of Specimen:  uterus  DISPOSITION OF SPECIMEN:  PATHOLOGY  COUNTS:  YES  TOURNIQUET:  * No tourniquets in log *  DICTATION: .Other Dictation: Dictation Number [id number]  PLAN OF CARE: Discharge to home after PACU  PATIENT DISPOSITION:  PACU - hemodynamically stable.   Delay start of Pharmacological VTE agent (>24hrs) due to surgical blood loss or risk of bleeding: not applicable

## 2013-11-16 NOTE — Anesthesia Preprocedure Evaluation (Signed)
Anesthesia Evaluation  Patient identified by MRN, date of birth, ID band Patient awake    Reviewed: Allergy & Precautions, H&P , NPO status , Patient's Chart, lab work & pertinent test results  Airway Mallampati: I TM Distance: >3 FB Neck ROM: full    Dental no notable dental hx. (+) Teeth Intact   Pulmonary neg pulmonary ROS,    Pulmonary exam normal       Cardiovascular hypertension, Pt. on medications     Neuro/Psych negative neurological ROS  negative psych ROS   GI/Hepatic negative GI ROS, Neg liver ROS,   Endo/Other  negative endocrine ROS  Renal/GU negative Renal ROS  negative genitourinary   Musculoskeletal negative musculoskeletal ROS (+)   Abdominal Normal abdominal exam  (+)   Peds  Hematology negative hematology ROS (+)   Anesthesia Other Findings   Reproductive/Obstetrics negative OB ROS                           Anesthesia Physical Anesthesia Plan  ASA: II  Anesthesia Plan: General   Post-op Pain Management:    Induction: Intravenous  Airway Management Planned: LMA  Additional Equipment:   Intra-op Plan:   Post-operative Plan:   Informed Consent: I have reviewed the patients History and Physical, chart, labs and discussed the procedure including the risks, benefits and alternatives for the proposed anesthesia with the patient or authorized representative who has indicated his/her understanding and acceptance.     Plan Discussed with: CRNA and Surgeon  Anesthesia Plan Comments:         Anesthesia Quick Evaluation

## 2013-11-16 NOTE — Interval H&P Note (Signed)
History and Physical Interval Note:  11/16/2013 8:52 AM  Sammuel HinesElizabeth A [name]  has presented today for surgery, with the diagnosis of menorrhagia, endometrial polyp  The various methods of treatment have been discussed with the patient and family. After consideration of risks, benefits and other options for treatment, the patient has consented to  Procedure(s): DILATATION & CURETTAGE/HYSTEROSCOPY WITH TRUCLEAR (N/A) as a surgical intervention .  The patient's history has been reviewed, patient examined, no change in status, stable for surgery.  I have reviewed the patient's chart and labs.  Questions were answered to the patient's satisfaction.     Jayvion Stefanski H

## 2013-11-17 ENCOUNTER — Encounter (HOSPITAL_COMMUNITY): Payer: Self-pay | Admitting: Gynecology

## 2013-11-17 NOTE — Op Note (Signed)
NAMMargarette Asal:  [name], Candace [name]             ACCOUNT NO.:  [id number]  MEDICAL RECORD NO.:  [id number]  LOCATION:  [facility]                          FACILITY:  [facility]  PHYSICIAN:  Ivor Costaracy H. Farrel GobbleLathrop, M.D. DATE OF BIRTH:  May 23, 1956  DATE OF PROCEDURE:  11/16/2013 DATE OF DISCHARGE:                              OPERATIVE REPORT   PREOPERATIVE DIAGNOSES: 1. Menometrorrhagia. 2. Endometrial polyp.  POSTOPERATIVE DIAGNOSES: 1. Menometrorrhagia. 2. Endometrial polyp.  PROCEDURE:  D and C hysteroscopy with TRUCLEAR.  SURGEON:  Ivor Costaracy H. Farrel GobbleLathrop, M.D.  ANESTHESIA:  MAC.  IV FLUIDS:  1300 mL of lactated Ringer's.  URINE OUTPUT:  40 mL.  ESTIMATED BLOOD LOSS:  Minimal.  I and O deficit is approximately 100 mL of normal saline.  PATHOLOGY:  Endometrial polyps and curettings.  INDICATIONS:  The patient is a 58 year old perimenopausal woman with menometrorrhagia who was noted on sonohysterogram to have two endometrial defects that were felt to be polyps.  One was approximately 1.5 cm in the left cornual area and a small polyp that was noted.  FINDINGS:  The uterus sounded to 7.  There was a large polyp arising from the left cornua and the smaller one in the mid fundal area.  The remainder of the cavity was unremarkable as well as the cervix.  PROCEDURE:  The patient was taken to the operating room, placed in a dorsal lithotomy position.  Prepped and draped in usual sterile fashion. Bimanual exam was performed.  Orientation of the uterus was confirmed. A sterile weighted speculum was then placed in the vagina.  The cervix was visualized and stabilized with a single-tooth tenaculum.  A paracervical block of equal parts 2% lidocaine and 0.25% Marcaine was placed for a total of 13 mL.  The cervix was noted to be dilated from Cytotec taken the evening before and the uterus sounded to 7.  A #19- [place] dilator was then advanced through the cervix without any resistance.  The TRUCLEAR was then  prepared.  The hysteroscope was advanced through the cervix and into the cavity and the findings were above.  TRUCLEAR blade was then advanced through the scope.  The plate was locked in place and then using the device, the polyp in the left cornual area was resected in its entirety.  Similarly, the fundal polyp and another smaller defect noted on the posterior wall were removed. The hysteroscope was then removed.  Sharp curettage was performed and sent as a separate specimen.  The patient tolerated the procedure well. Sponge, lab, and needle counts were correct x2.  She was transferred to the recovery room in stable condition.     Ivor Costaracy H. Farrel GobbleLathrop, M.D.     THL/MEDQ  D:  11/16/2013  T:  11/16/2013  Job:  [id number]

## 2013-11-21 ENCOUNTER — Telehealth: Payer: Self-pay | Admitting: *Deleted

## 2013-11-21 NOTE — Telephone Encounter (Signed)
Message copied by Alisa GraffYEAKLEY, Jun Osment on Mon Nov 21, 2013  1:49 PM ------      Message from: Douglass RiversLATHROP, TRACY      Created: Fri Nov 18, 2013  8:23 AM       Inform benign polyp ------

## 2013-11-28 NOTE — Telephone Encounter (Signed)
Patient returned call, notified of path report per Dr Liliana ClineLathrop's instruction. Has post op appt scheduled for 12-05-13.

## 2013-11-28 NOTE — Telephone Encounter (Signed)
Message copied by Alisa GraffYEAKLEY, SARAH on Mon Nov 28, 2013  2:18 PM ------      Message from: Douglass RiversLATHROP, TRACY      Created: Fri Nov 18, 2013  8:23 AM       Inform benign polyp ------

## 2013-11-28 NOTE — Telephone Encounter (Signed)
LMTCB home and cell number. LMTCB.

## 2013-12-05 ENCOUNTER — Encounter: Payer: Self-pay | Admitting: Gynecology

## 2013-12-05 ENCOUNTER — Ambulatory Visit (INDEPENDENT_AMBULATORY_CARE_PROVIDER_SITE_OTHER): Payer: BC Managed Care – PPO | Admitting: Gynecology

## 2013-12-05 VITALS — BP 123/94 | HR 67 | Resp 14 | Ht 63.25 in | Wt 164.0 lb

## 2013-12-05 DIAGNOSIS — N840 Polyp of corpus uteri: Secondary | ICD-10-CM

## 2013-12-05 DIAGNOSIS — N920 Excessive and frequent menstruation with regular cycle: Secondary | ICD-10-CM

## 2013-12-05 DIAGNOSIS — Z9889 Other specified postprocedural states: Secondary | ICD-10-CM

## 2013-12-05 NOTE — Progress Notes (Signed)
Subjective:     Patient ID: Candace [name], female   DOB: 05/08/1956, 58 y.o.   MRN: [id number]  HPI Comments: Here 2w post-op s/p D&C hysteroscopy for menorrhagia and endometrial polyps.  Pt is without complaints, she reports minimal bleeding post-op, no menses since OR     Review of Systems  Constitutional: Negative for fever and chills.  Genitourinary: Negative for dysuria, vaginal bleeding, vaginal discharge and vaginal pain.       Objective:   Physical Exam  Nursing note and vitals reviewed. Constitutional: She is oriented to person, place, and time. She appears well-developed and well-nourished.  Neurological: She is alert and oriented to person, place, and time.  Pelvic exam: VULVA: normal appearing vulva with no masses, tenderness or lesions, VAGINA: normal appearing vagina with normal color and discharge, no lesions, CERVIX: normal appearing cervix without discharge or lesions, UTERUS: uterus is normal size, shape, consistency and nontender, ADNEXA: normal adnexa in size, nontender and no masses.      Assessment:     Post-op doing well     Plan:     Operative findings and photos/path reviewed Questions addressed Will monitor cycles going forward F/u prn

## 2014-02-16 ENCOUNTER — Ambulatory Visit (INDEPENDENT_AMBULATORY_CARE_PROVIDER_SITE_OTHER): Payer: BC Managed Care – PPO | Admitting: Obstetrics & Gynecology

## 2014-02-16 ENCOUNTER — Ambulatory Visit (INDEPENDENT_AMBULATORY_CARE_PROVIDER_SITE_OTHER): Payer: BC Managed Care – PPO

## 2014-02-16 ENCOUNTER — Ambulatory Visit: Payer: BC Managed Care – PPO | Admitting: Obstetrics and Gynecology

## 2014-02-16 ENCOUNTER — Telehealth: Payer: Self-pay | Admitting: Gynecology

## 2014-02-16 VITALS — BP 124/82 | HR 64 | Resp 20 | Ht 63.25 in | Wt 164.4 lb

## 2014-02-16 DIAGNOSIS — N920 Excessive and frequent menstruation with regular cycle: Secondary | ICD-10-CM

## 2014-02-16 DIAGNOSIS — N950 Postmenopausal bleeding: Secondary | ICD-10-CM

## 2014-02-16 LAB — POCT URINE PREGNANCY: Preg Test, Ur: NEGATIVE

## 2014-02-16 MED ORDER — MEGESTROL ACETATE 20 MG PO TABS: 20.0000 mg | ORAL_TABLET | Freq: Three times a day (TID) | ORAL | Status: DC

## 2014-02-16 MED ORDER — NORETHINDRONE 0.35 MG PO TABS: 1.0000 | ORAL_TABLET | Freq: Every day | ORAL | Status: DC

## 2014-02-16 NOTE — Telephone Encounter (Signed)
Routing to Dr. Hyacinth MeekerMiller for signature.  Will close encounter.

## 2014-02-16 NOTE — Progress Notes (Signed)
58 y.o.Marriedfemale here for a pelvic ultrasound due to signficant vaginal bleeding.  Pt underwent a hysteroscopic polyp resection 11/16/13 for endometrial polyp that was benign.  Cycles have not been very heavy but this one started off like a regular period and she work up this morning in really heavy blood.  She is changing a tampon every two hours and still bleeding through that.    Sexually active:  yes  Contraception: vasectomy  FINDINGS: UTERUS: 7.6 x 4.8 x 4.1cm EMS: 8.650mm with possible feeder vessel? ADNEXA:   Left ovary 1.8 x 1.1 x 1.0cm   Right ovary 1.9 x 1.4 x 1.5cm CUL DE SAC: no free fluid  Endometrial biopsy not performed due to recent normal pathology.  Feel need to get bleeding stopped.  Will start Aygestin and taper to micronor.  Feel pt needs to have repeat imaging in a month or two to see if endometrium remains this thickened.  If so, may need to see if there was only partial resection of the polyp.    UPT is negative today.  Hb 12.2.  Gyn exam normal except for significant bleeding.  Assessment:  Menorrhagia H/O endometrial polyp s/p resection 1/15  Plan: FSH, TSH today Aygestin 20mg  BID.  Once bleeding stops, taper to qd and then take for three to four more days.  Once no bleeding for several days, transition to micronor.  Repeat PUS 4-8 weeks.  ~15 minutes spent with patient >50% of time was in face to face discussion of above.

## 2014-02-16 NOTE — Telephone Encounter (Signed)
Pt is having some irregular heavy bleeding and wants to talk with nurse.

## 2014-02-16 NOTE — Telephone Encounter (Signed)
Spoke with patient. She is s/p D&C hysteroscopy for menorrhagia and endometrial polyps in 1/15 with Dr. Farrel GobbleLathrop. She has been having two days of "period like" bleeding and this morning woke up at 0500 this morning with saturated tampon and soilage on her undergarments. Today she states since 0500 this morning, she has changed 4 super tampons that are saturated. Patient agreeable to office visit. Spoke with Kennon RoundsSally, advised patient can come in to office now and we can have her be seen.

## 2014-02-16 NOTE — Progress Notes (Deleted)
Subjective:     Patient ID: Candace [name], female   DOB: 10/01/1956, 58 y.o.   MRN: [id number]  HPI   Review of Systems     Objective:   Physical Exam     Assessment:     ***    Plan:     ***

## 2014-02-17 ENCOUNTER — Telehealth: Payer: Self-pay | Admitting: Obstetrics & Gynecology

## 2014-02-17 DIAGNOSIS — R9389 Abnormal findings on diagnostic imaging of other specified body structures: Secondary | ICD-10-CM

## 2014-02-17 LAB — FOLLICLE STIMULATING HORMONE: FSH: 11.9 m[IU]/mL

## 2014-02-17 LAB — TSH: TSH: 1.108 u[IU]/mL (ref 0.350–4.500)

## 2014-02-17 NOTE — Telephone Encounter (Signed)
Spoke with pt.  She is going to decrease Megace to 20mg  twice daily until bleeding is very light.  She will then decrease it again to once daily for two to three more days and then start on the Micronor.  Gave results of FSH (11.9) and TSH (normal).  She understands she is still making estrogen but probably not ovulating regularly and this is why she has the heavy bleeding.  All questions answered.  French Anaracy, Can you check on pt on Monday?  Thanks.

## 2014-02-17 NOTE — Telephone Encounter (Signed)
Patient seen yesterday by Dr. Hyacinth MeekerMiller and has some questions about whether to continue taking progesterone "heavy dose." The patient is concerned and requests a call back today.

## 2014-02-17 NOTE — Telephone Encounter (Signed)
Spoke with patient. She states she is doing much better and bleeding has decreased, she has had 5 doses of Megace. Now changing super tampon q 4 hours. She is wondering if she should continue with Megace or switch to Micronor. I advised that she should continue with Megace as ordered until she hears further from Dr. Hyacinth MeekerMiller. Patient is concerned that Progesterone amount was high and "got a little worried" when she was reading pharmacy inserts. Advised that Megace is only for short term to stop the bleeding that she was having. I advised I would send message to Dr. Hyacinth MeekerMiller for further instructions.

## 2014-02-20 NOTE — Telephone Encounter (Signed)
Spoke with patient. She states she is doing well. Denies any heavy bleeding. She is still taking Megace BID. Instructions from Dr. Hyacinth MeekerMiller reviewed again. Patient verbalized understanding of plan. She states that Dr. Hyacinth MeekerMiller advised that she will need follow up imaging as well in one month. Should she complete one pack of micronor first? Then will need PUS or SHGM?

## 2014-02-20 NOTE — Telephone Encounter (Signed)
Message left to return call to Loyal Rudy at [phone].    

## 2014-02-20 NOTE — Telephone Encounter (Signed)
PUS only.  Doesn't have to complete one entire month.  Just schedule for about a month from now.  Thanks.  Order placed.

## 2014-02-21 NOTE — Telephone Encounter (Signed)
Message left to return call to Annel Zunker at [phone].    

## 2014-02-24 ENCOUNTER — Telehealth: Payer: Self-pay | Admitting: Obstetrics & Gynecology

## 2014-02-24 NOTE — Telephone Encounter (Signed)
Mailed the In-Office procedure form that includes appointment date and time, patient copay, and cancellation policy. °

## 2014-02-24 NOTE — Telephone Encounter (Signed)
Message left to return call to Drue Camera at [phone].    

## 2014-02-24 NOTE — Telephone Encounter (Signed)
Spoke with patient and scheduled PUS for 03/30/14. 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 PUS for 1600 scheduled.  Has had prior pus in past, verbalized understanding of cancellation procedure.    Patient states for last two days she has noticed an odor in vaginal area when removing tampons and urinating. No pain when urinating. Feels as though urine may smell different. Denies fevers or abdominal pain. Discussed with Dr. Hyacinth MeekerMiller, can use otc hydrogen peroxide 3% douche mixed 1:1 with water to help with vaginal odor. To call back with any further concerns.   Message left to return call to Auroraracy at [phone].

## 2014-03-08 ENCOUNTER — Encounter: Payer: Self-pay | Admitting: Obstetrics & Gynecology

## 2014-03-08 NOTE — Progress Notes (Signed)
Please see additional note from same day as I decided to proceed with PUS evalutation.

## 2014-03-09 ENCOUNTER — Telehealth: Payer: Self-pay | Admitting: Obstetrics & Gynecology

## 2014-03-09 ENCOUNTER — Encounter: Payer: Self-pay | Admitting: Obstetrics & Gynecology

## 2014-03-09 NOTE — Telephone Encounter (Signed)
Patient calling to give a report to the nurse on the birth control she is taking to help with bleeding. She is taking Camilla.

## 2014-03-10 NOTE — Telephone Encounter (Signed)
Left message to call Mc Bloodworth at [phone]. 

## 2014-03-13 ENCOUNTER — Ambulatory Visit (INDEPENDENT_AMBULATORY_CARE_PROVIDER_SITE_OTHER): Payer: BC Managed Care – PPO | Admitting: Gynecology

## 2014-03-13 VITALS — BP 118/70 | HR 68 | Resp 16 | Ht 63.25 in | Wt 160.0 lb

## 2014-03-13 DIAGNOSIS — N760 Acute vaginitis: Secondary | ICD-10-CM

## 2014-03-13 MED ORDER — METRONIDAZOLE 500 MG PO TABS: 500.0000 mg | ORAL_TABLET | Freq: Two times a day (BID) | ORAL | Status: DC

## 2014-03-13 NOTE — Telephone Encounter (Signed)
Left message to call Jhovany Weidinger at [phone]. 

## 2014-03-13 NOTE — Telephone Encounter (Signed)
Spoke with patient. Patient states for one week she has been experiencing a foul odor from her vagina with discharge. Discharge is brown/red/green in color. "I thought that it was old blood at first." Last week patient states that she had chills, was bloated with abdominal pain, and thought she had food poisoning but now she is not sure if it was related. Patients states "Today I thought that there might be an old tampon in there still. So I reached up there and I can feel something but I can't get it out myself with my fingers." Denies fevers, chills, bloating, abdominal pain, and itching at this time. Advised needs to come in to be seen in office today for evaluation. Patient agreeable. Appointment scheduled for 5:30 with Dr.Lathrop (time per Kennon RoundsSally). Patient agreeable and verbalizes understanding.  Routing to provider for final review. Patient agreeable to disposition. Will close encounter.  CC: Dr.Lathrop

## 2014-03-13 NOTE — Progress Notes (Signed)
Pt was seen here a few weeks ago for menorrhagia and was started on megace and transitioned to micronor and has been on for 2w.  Pt states that her last cycle was very heavy.   Pt has been having daily bleeding on micronor.  Pt at the heaviest was using 2 tampons at once.  She reports an odor and is unsure if she may have left a tampon in. She thought she felt something but could not remove. Pt denies fever or chills.  BP 118/70  Pulse 68  Resp 16  Ht 5' 3.25" (1.607 m)  Wt 160 lb (72.576 kg)  BMI 28.10 kg/m2 General appearance: alert, cooperative and appears stated age  Pelvic: External genitalia:  no lesions              Urethra:  normal appearing urethra with no masses, tenderness or lesions              Bartholins and Skenes: normal                 Vagina: digital exam confirms foreign body, speculum placed and tampon noted high in vault, removed with ring forcept, malodor appreciated, mild vaginal erythema, no tenderness              Cervix: normal appearance, no CMT                      Bimanual Exam:  Uterus:  uterus is normal size, shape, consistency and nontender                                      Adnexa: normal adnexa in size, nontender and no masses                                        Assessment: Retained tampon  Plan: Removed prophylactic antibiotics provided-flagyl Pt aware can have btb related both to new onset of camilla or low grade infection, should call if continues, or develops fever or chills

## 2014-03-30 ENCOUNTER — Ambulatory Visit (INDEPENDENT_AMBULATORY_CARE_PROVIDER_SITE_OTHER): Payer: BC Managed Care – PPO

## 2014-03-30 ENCOUNTER — Ambulatory Visit (INDEPENDENT_AMBULATORY_CARE_PROVIDER_SITE_OTHER): Payer: BC Managed Care – PPO | Admitting: Obstetrics & Gynecology

## 2014-03-30 VITALS — BP 122/78 | Ht 63.25 in | Wt 163.0 lb

## 2014-03-30 DIAGNOSIS — R9389 Abnormal findings on diagnostic imaging of other specified body structures: Secondary | ICD-10-CM

## 2014-03-30 DIAGNOSIS — N920 Excessive and frequent menstruation with regular cycle: Secondary | ICD-10-CM

## 2014-03-30 NOTE — Progress Notes (Signed)
58 y.o.  [id number] Married white female here for repeat pelvic ultrasound for evaluation of endometrium and to see if possible polyp was indeed a polyp or was clot.  Pt underwent PUS 02/16/14 when she presented for signficant and heavy vaginal bleeding.  Hx significnat for hysterosocpy with polyp reseciton 11/16/13.  Bleeding really didn't improve after the procedure.    With her heavy bleeding, she was started on Aygestin and transitioned to Micronor.  She is doing well on this and hasn't bled for several weeks.  She has just finished the first pack and is transitioning into the second pack.  Feels a little moody with this.  Relayed a story of a bus driver at school who was texting while driving and this really upset the patient.  She felt she would not have responded this way a year ago.  She, still, is very happy about not bleeding and doesn't want to stop the medication right now.  No LMP recorded.  Sexually active:  yes  Contraception: oral progesterone-only contraceptive  FINDINGS: UTERUS: 6.6 x 5.4 x 4.3cm EMS: 2.34mm without evidence of polyp, symmetric ADNEXA:   Left ovary 2.3 x 1.5 x 1.6cm   Right ovary 1.7 x 0.8 x 0.8cm CUL DE SAC: no free fluid  Images reviewed with patient and findings discussed.  Pt had several questions about possible side effects from the micronor, specifically mood issues.  She does feel some of this could be due to being at end of school year and just being glad it is close.  She is going to monitor these symptoms and let me know in the summer if she wants to continue the medication.    Assessment: Menorrhagia, thickened endometrium, much improved on Micronor Plan: Continue micronor If no issues, at AEX would recommend testing FSH.  Can stop progestin only pill once she is fully in menopause.  Pt in agreement with current plan but will call if desires to stop pill sooner.  ~25 minutes spent with patient >50% of time was in face to face discussion of  above.

## 2014-03-31 ENCOUNTER — Encounter: Payer: Self-pay | Admitting: Obstetrics & Gynecology

## 2014-04-26 ENCOUNTER — Ambulatory Visit: Payer: BC Managed Care – PPO | Admitting: Obstetrics and Gynecology

## 2014-08-07 ENCOUNTER — Other Ambulatory Visit: Payer: Self-pay

## 2014-08-07 ENCOUNTER — Telehealth: Payer: Self-pay | Admitting: Obstetrics & Gynecology

## 2014-08-07 DIAGNOSIS — Z1231 Encounter for screening mammogram for malignant neoplasm of breast: Secondary | ICD-10-CM

## 2014-08-07 MED ORDER — NORETHINDRONE 0.35 MG PO TABS: 1.0000 | ORAL_TABLET | Freq: Every day | ORAL | Status: DC

## 2014-08-07 NOTE — Telephone Encounter (Signed)
OK to refill Micronor until annual exam as long as patient is up to date on mammogram.  I did not see a recent mammogram.  If she has had it done, let's get a copy.  If it is not done, have this scheduled.  Thanks.

## 2014-08-07 NOTE — Telephone Encounter (Signed)
Pt calling for a medication recheck appointment with Dr Hyacinth Meeker.

## 2014-08-07 NOTE — Telephone Encounter (Signed)
Pt says she made an appointment for mammogram [facility] the [facility] for 10/16 at 4:30.

## 2014-08-07 NOTE — Telephone Encounter (Signed)
Patient has appointment scheduled for 10/16 at 4:30pm at the [facility] Rx for micronor #1 with 3RF sent to pharmacy on file. Spoke with patient. Advised rx sent to pharmacy until aex. Patient is agreeable.  Routing to provider for final review. Patient agreeable to disposition. Will close encounter ;

## 2014-08-07 NOTE — Telephone Encounter (Signed)
Spoke with patient. Patient states that she is calling to report how she is doing on micronor and is not sure if she needs to come in for a follow up appointment since she ran out of refills of rx. "I am not having any side effects and feel great taking it. I have had no bleeding." Advised of note as seen below from Dr.Miller. Patient is agreeable and would like to continue med at this time until aex. Aex scheduled for 12/21 at 2:45pm with Dr.Miller as patient wishes to wait to see her for aex. Advised patient would send a message over to covering provider to give update on how she is doing with medication and call back with any further instructions or recommendations. Patient agreeable.  Plan: Continue micronor  If no issues, at AEX would recommend testing FSH. Can stop progestin only pill once she is fully in menopause. Pt in agreement with current plan but will call if desires to stop pill sooner.   Dr.Silva okay to refill micronor until aex?

## 2014-08-25 ENCOUNTER — Ambulatory Visit
Admission: RE | Admit: 2014-08-25 | Discharge: 2014-08-25 | Disposition: A | Discharge status: Home / Self Care | Payer: BC Managed Care – PPO | Source: Ambulatory Visit

## 2014-08-25 DIAGNOSIS — Z1231 Encounter for screening mammogram for malignant neoplasm of breast: Secondary | ICD-10-CM

## 2014-09-11 ENCOUNTER — Encounter: Payer: Self-pay | Admitting: Obstetrics & Gynecology

## 2014-09-26 ENCOUNTER — Telehealth: Payer: Self-pay | Admitting: Obstetrics & Gynecology

## 2014-09-26 NOTE — Telephone Encounter (Signed)
lmtcb to reschedule AEX cancelled for 10/30/14 with Dr. Hyacinth MeekerMiller.

## 2014-10-02 ENCOUNTER — Other Ambulatory Visit: Payer: Self-pay | Admitting: Obstetrics & Gynecology

## 2014-10-02 MED ORDER — NORETHINDRONE 0.35 MG PO TABS: 1.0000 | ORAL_TABLET | Freq: Every day | ORAL | Status: DC

## 2014-10-02 NOTE — Telephone Encounter (Signed)
Last AEX 09/09/13 Last refill 08/07/14 #1pack/# refill Next appt 11/07/14   Appt 10/30/14 cancelled by provider   Rx sent for 1 month to last until appt

## 2014-10-02 NOTE — Telephone Encounter (Signed)
Pt would like a rx for the errin-norethindrone pills [facility] at [phone]

## 2014-10-04 ENCOUNTER — Telehealth: Payer: Self-pay | Admitting: Obstetrics & Gynecology

## 2014-10-04 NOTE — Telephone Encounter (Signed)
10/02/14 #1 pack was sent to [facility] on [address]. Called [facility] pharmacy and s/w pharmacist Verdon CumminsJesse they did not received that rx. Called in Norethindrone 0.35 #1 pack with 1 rfs since patient's AEX is 11/07/14 with Dr. Hyacinth MeekerMiller.  Last AEX: 09/09/13 with Dr. Farrel GobbleLathrop  Last Mammogram: 08/28/14 Bi-Rads 1: Negative AEX scheduled: 11/07/14 with Dr. Hyacinth MeekerMiller  Patient is aware.  Routed to provider for review, encounter closed.

## 2014-10-04 NOTE — Telephone Encounter (Signed)
Pt is requesting a refill for her progesterone. She will take her last pill on Saturday. Please send it to [facility] on [address].

## 2014-10-30 ENCOUNTER — Ambulatory Visit: Payer: BC Managed Care – PPO | Admitting: Obstetrics & Gynecology

## 2014-11-07 ENCOUNTER — Encounter: Payer: Self-pay | Admitting: Obstetrics & Gynecology

## 2014-11-07 ENCOUNTER — Ambulatory Visit (INDEPENDENT_AMBULATORY_CARE_PROVIDER_SITE_OTHER): Payer: BC Managed Care – PPO | Admitting: Obstetrics & Gynecology

## 2014-11-07 VITALS — BP 144/86 | HR 68 | Resp 16 | Wt 161.0 lb

## 2014-11-07 DIAGNOSIS — Z01419 Encounter for gynecological examination (general) (routine) without abnormal findings: Secondary | ICD-10-CM

## 2014-11-07 DIAGNOSIS — J45909 Unspecified asthma, uncomplicated: Secondary | ICD-10-CM | POA: Insufficient documentation

## 2014-11-07 DIAGNOSIS — J4520 Mild intermittent asthma, uncomplicated: Secondary | ICD-10-CM

## 2014-11-07 DIAGNOSIS — N938 Other specified abnormal uterine and vaginal bleeding: Secondary | ICD-10-CM

## 2014-11-07 DIAGNOSIS — Z0000 Encounter for general adult medical examination without abnormal findings: Secondary | ICD-10-CM

## 2014-11-07 LAB — POCT URINALYSIS DIPSTICK
Bilirubin, UA: NEGATIVE
GLUCOSE UA: NEGATIVE
Ketones, UA: NEGATIVE
LEUKOCYTES UA: NEGATIVE
Nitrite, UA: NEGATIVE
Protein, UA: NEGATIVE
UROBILINOGEN UA: NEGATIVE
pH, UA: 6

## 2014-11-07 MED ORDER — NORETHINDRONE 0.35 MG PO TABS: 1.0000 | ORAL_TABLET | Freq: Every day | ORAL | Status: DC

## 2014-11-07 NOTE — Progress Notes (Signed)
58 y.o. [id number] MarriedCaucasianF here for annual exam.  Dealing with asthmatic bronchitis.  Had hives with this, as well, and is trying to figure out the cause.  So, off BP meds as well.  Pt reports today is the first day she feels more "normal".    Patient hasn't had any bleeding since April.  Taking micronor regularly.    Patient's last menstrual period was 02/08/2014.          Sexually active: Yes.    The current method of family planning is vasectomy.    Exercising: Yes.    yoga Smoker:  no  Health Maintenance: Pap:  09/09/13 WNL/negative HR HPV History of abnormal Pap:  yes MMG:  08/25/14 3D-normal Colonoscopy:  2013-repeat in 10 years BMD:   none TDaP:  ? Screening Labs: Dr. Kevan NyGates, Hb today: n/a, Urine today: PH-6.0, RBC-trace   reports that she has never smoked. She has never used smokeless tobacco. She reports that she drinks about 1.8 - 2.4 oz of alcohol per week. She reports that she does not use illicit drugs.  Past Medical History  Diagnosis Date  . Hypertension   . Infertility, female   . Abnormal Pap smear   . SVD (spontaneous vaginal delivery)     x 2  . Arthritis     left shoulder   . Asthmatic bronchitis     12/15    Past Surgical History  Procedure Laterality Date  . Vaginal wall repair      after delivery  . Cryotherapy      for abnormal pap  . Dilatation & curettage/hysteroscopy with trueclear N/A 11/16/2013    Procedure: DILATATION & CURETTAGE/HYSTEROSCOPY WITH TRUCLEAR;  Surgeon: Bennye Almracy H Lathrop, MD;  Location: [facility];  Service: Gynecology;  Laterality: N/A;    Current Outpatient Prescriptions  Medication Sig Dispense Refill  . FLUARIX QUADRIVALENT 0.5 ML injection   0  . Ibuprofen (ADVIL PO) Take by mouth as needed.    . Multiple Vitamins-Minerals (MULTIVITAMIN PO) Take by mouth as needed.    . norethindrone (MICRONOR,CAMILA,ERRIN) 0.35 MG tablet Take 1 tablet (0.35 mg total) by mouth daily. 1 Package 0  . predniSONE (DELTASONE) 20 MG tablet      . VENTOLIN HFA 108 (90 BASE) MCG/ACT inhaler     . azithromycin (ZITHROMAX) 250 MG tablet     . lisinopril-hydrochlorothiazide (PRINZIDE,ZESTORETIC) 10-12.5 MG per tablet Take 1 tablet by mouth daily.     No current facility-administered medications for this visit.    Family History  Problem Relation Age of Onset  . Cancer Mother     lung  . Hypertension Mother   . Diabetes Brother   . Heart disease Brother   . Cancer Other     leg  . Cancer Brother     lung    ROS:  Pertinent items are noted in HPI.  Otherwise, a comprehensive ROS was negative.  Exam:   BP 144/86 mmHg  Pulse 68  Resp 16  Wt 161 lb (73.029 kg)  LMP 02/08/2014       Ht Readings from Last 3 Encounters:  03/30/14 5' 3.25" (1.607 m)  03/13/14 5' 3.25" (1.607 m)  02/16/14 5' 3.25" (1.607 m)    General appearance: alert, cooperative and appears stated age Head: Normocephalic, without obvious abnormality, atraumatic Neck: no adenopathy, supple, symmetrical, trachea midline and thyroid normal to inspection and palpation Lungs: clear to auscultation bilaterally Breasts: normal appearance, no masses or tenderness Heart: regular rate and rhythm  Abdomen: soft, non-tender; bowel sounds normal; no masses,  no organomegaly Extremities: extremities normal, atraumatic, no cyanosis or edema Skin: Skin color, texture, turgor normal. No rashes or lesions Lymph nodes: Cervical, supraclavicular, and axillary nodes normal. No abnormal inguinal nodes palpated Neurologic: Grossly normal   Pelvic: External genitalia:  no lesions              Urethra:  normal appearing urethra with no masses, tenderness or lesions              Bartholins and Skenes: normal                 Vagina: normal appearing vagina with normal color and discharge, no lesions              Cervix: no lesions              Pap taken: No. Bimanual Exam:  Uterus:  normal size, contour, position, consistency, mobility, non-tender              Adnexa:  normal adnexa and no mass, fullness, tenderness               Rectovaginal: Confirms               Anus:  normal sphincter tone, no lesions  Chaperone was present for exam.  A:  Well Woman with normal exam H/O DUB that resolved with micronor use.  Now amenorrheic with Micronor.  Current asthmatic bronchitis  P:   Mammogram yearly.  Doing 3D. pap smear with neg HR HPV 2014. FSH today. Rx for micronor to pharmacy.  [facility]   return annually or prn

## 2014-11-08 ENCOUNTER — Other Ambulatory Visit: Payer: Self-pay | Admitting: Family Medicine

## 2014-11-08 ENCOUNTER — Ambulatory Visit
Admission: RE | Admit: 2014-11-08 | Discharge: 2014-11-08 | Disposition: A | Discharge status: Home / Self Care | Payer: BC Managed Care – PPO | Source: Ambulatory Visit | Attending: Family Medicine | Admitting: Family Medicine

## 2014-11-08 DIAGNOSIS — R05 Cough: Secondary | ICD-10-CM

## 2014-11-08 DIAGNOSIS — R053 Chronic cough: Secondary | ICD-10-CM

## 2014-11-08 LAB — FOLLICLE STIMULATING HORMONE: FSH: 107.6 m[IU]/mL

## 2014-11-14 ENCOUNTER — Telehealth: Payer: Self-pay | Admitting: Emergency Medicine

## 2014-11-14 NOTE — Telephone Encounter (Signed)
Detailed message left on voicemail.  Okay for detailed messages per designated party release form.  Advised if any questions to return call to our office.

## 2014-11-14 NOTE — Telephone Encounter (Signed)
-----   Message from Annamaria BootsMary Suzanne Miller, MD sent at 11/09/2014  9:12 AM EST ----- Please inform pt fsh is in menopause range.  Can stop OCPs.  She needs to let me know if has any bleeding.

## 2015-12-23 IMAGING — CR DG CHEST 2V
2 series · 2 of 2 positions shown · non-contrast
Comparison: None.

CLINICAL DATA: Cough and fever.

EXAM:
CHEST  2 VIEW

[view not recorded (1 of 2)]
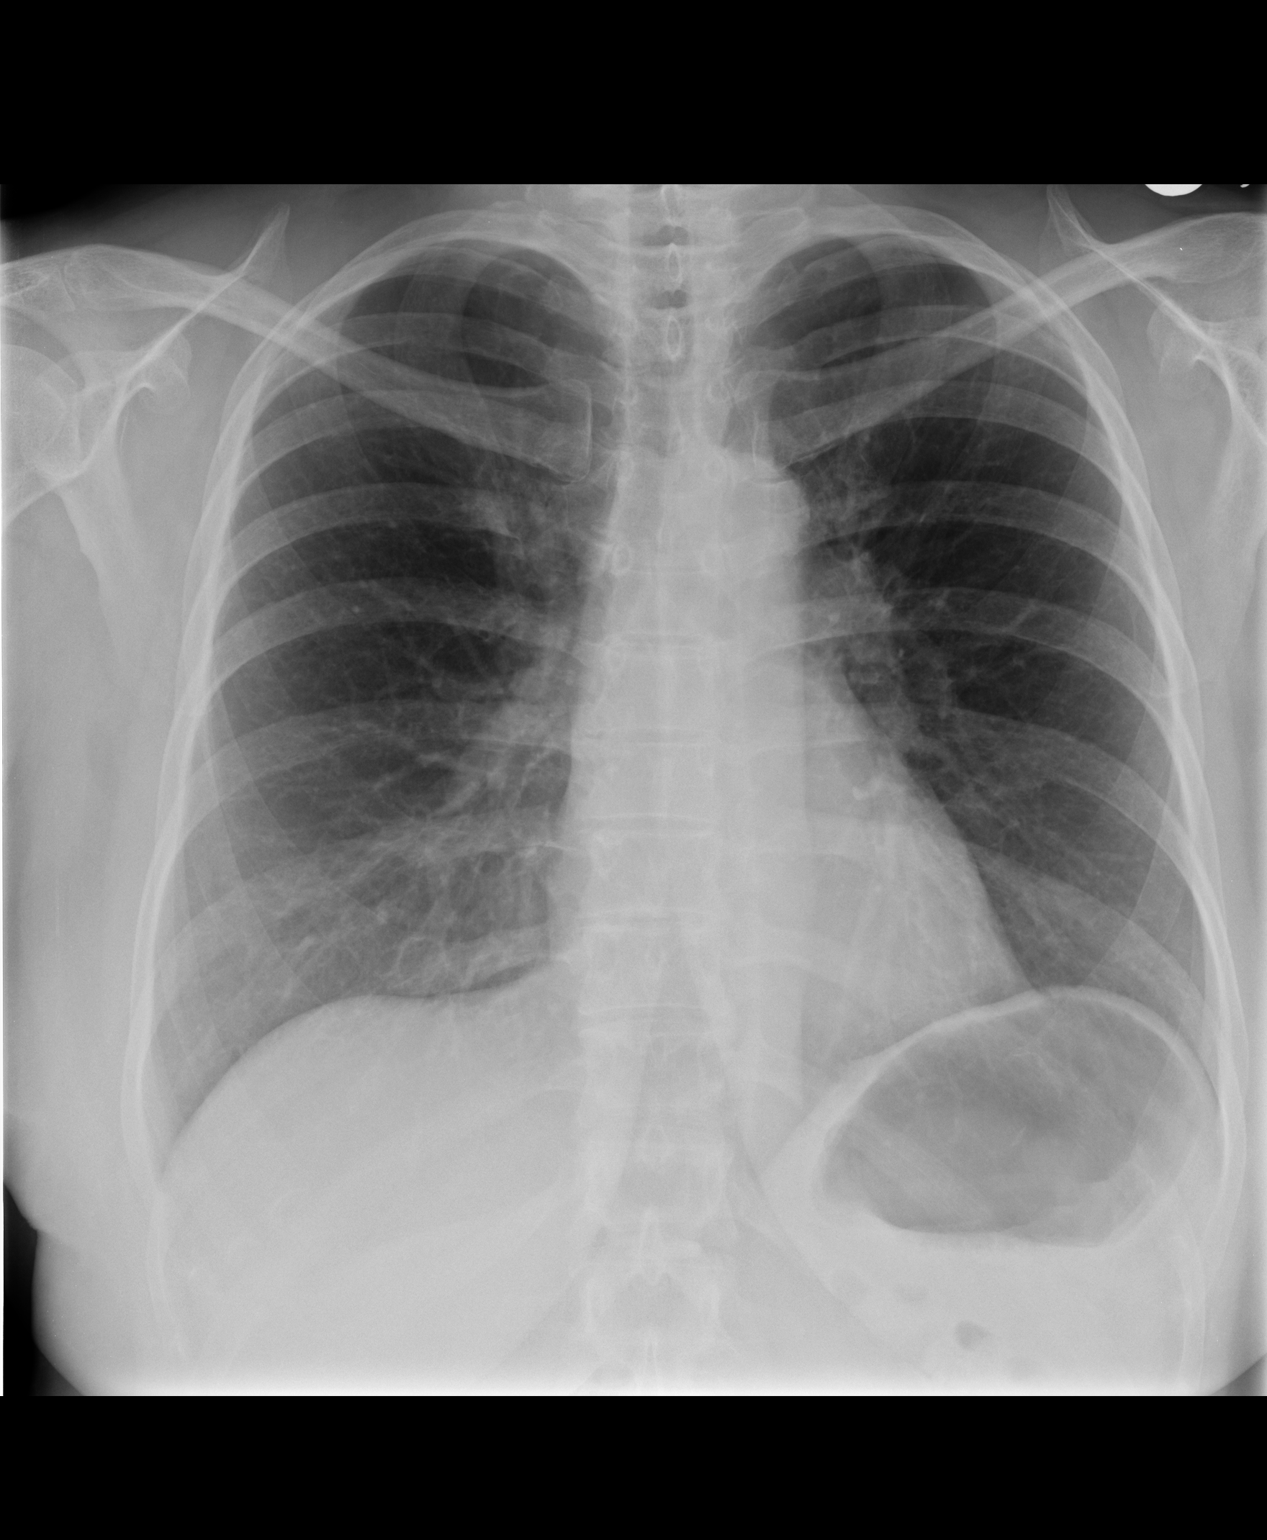

[view not recorded (2 of 2)]
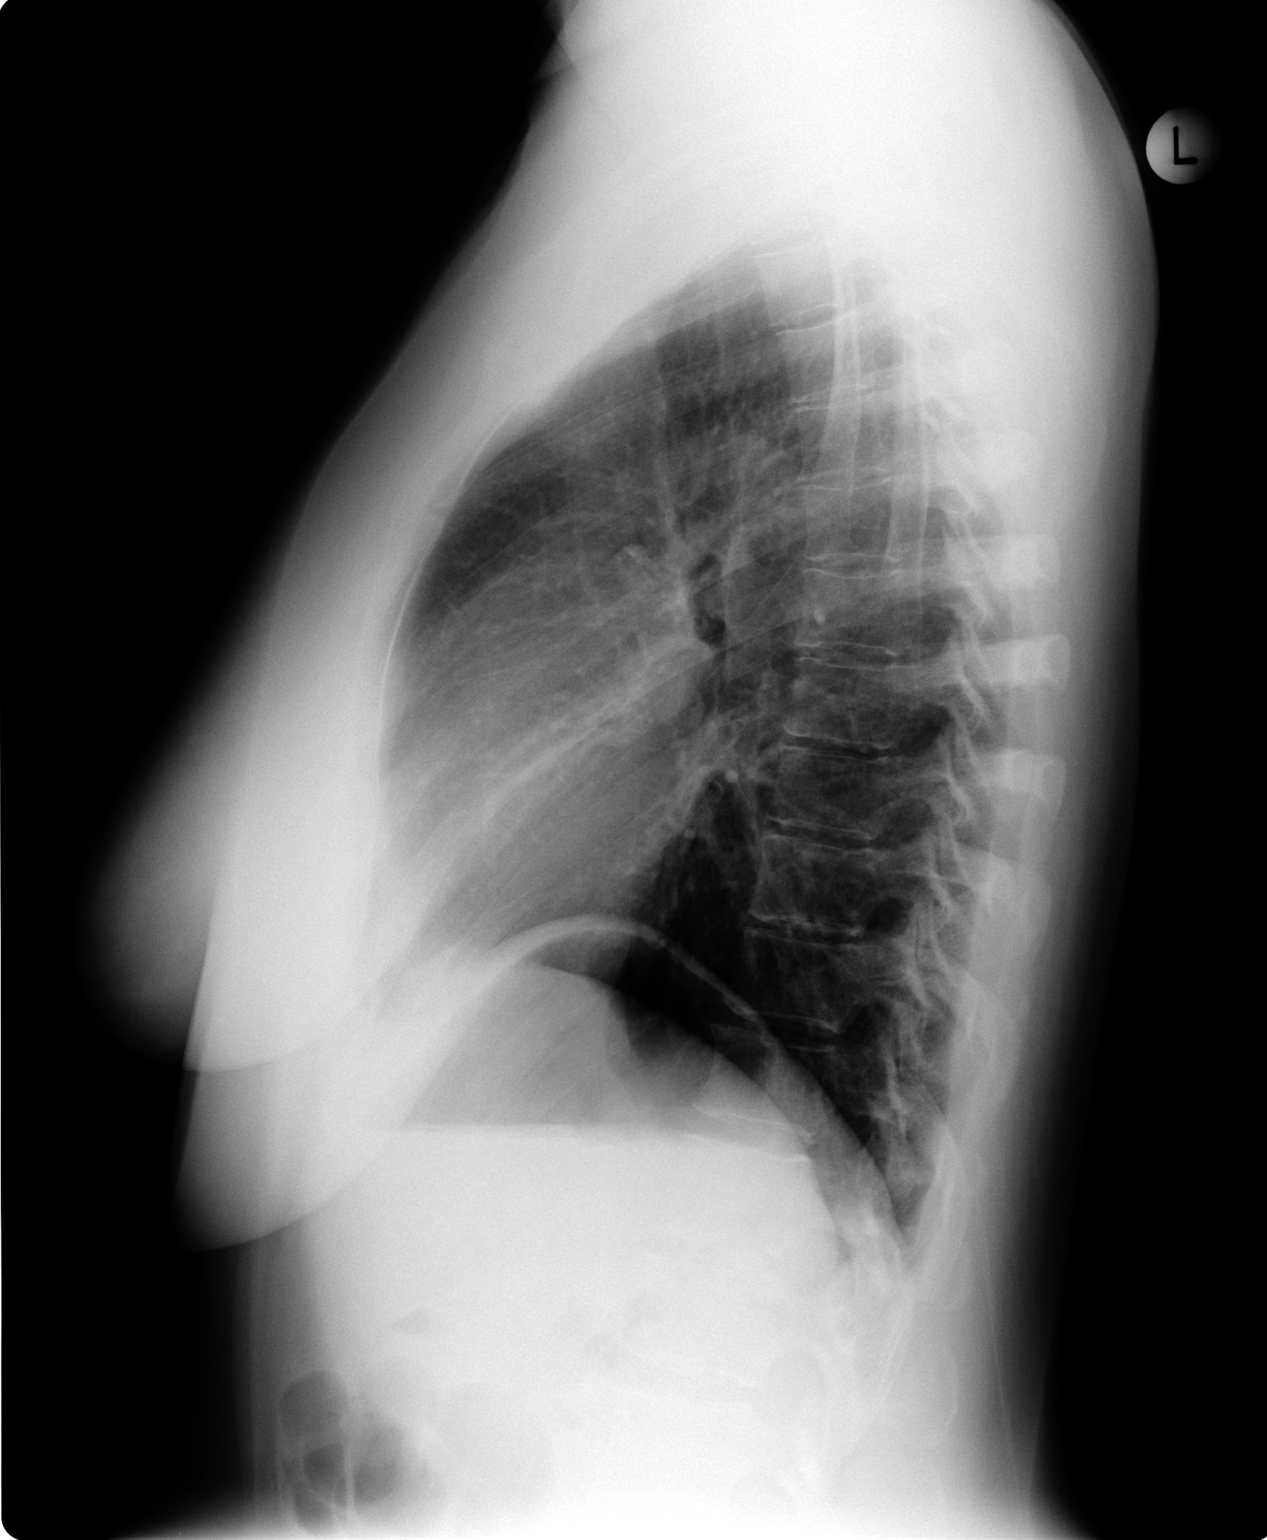

[2 of 2 positions shown; findings below may reference images not displayed]

FINDINGS: Mediastinum hilar structures normal. Mild right base size and
atelectasis and/or infiltrate cannot be excluded. Left lung is
clear. Heart size is normal. No pleural effusion or pneumothorax. No
acute bony abnormality.
IMPRESSION: Mild medial right basilar subsegmental atelectasis versus infiltrate
cannot be excluded.

## 2016-01-08 ENCOUNTER — Telehealth: Payer: Self-pay | Admitting: Obstetrics & Gynecology

## 2016-01-08 NOTE — Telephone Encounter (Signed)
Left patient a message to call back to reschedule a future appointment that was cancelled by the provider. °

## 2016-01-18 ENCOUNTER — Ambulatory Visit: Payer: BC Managed Care – PPO | Admitting: Obstetrics & Gynecology

## 2016-02-04 ENCOUNTER — Encounter: Payer: Self-pay | Admitting: Obstetrics & Gynecology

## 2016-02-04 ENCOUNTER — Ambulatory Visit (INDEPENDENT_AMBULATORY_CARE_PROVIDER_SITE_OTHER): Payer: BC Managed Care – PPO | Admitting: Obstetrics & Gynecology

## 2016-02-04 VITALS — BP 118/80 | HR 62 | Resp 18 | Ht 63.25 in | Wt 165.0 lb

## 2016-02-04 DIAGNOSIS — Z0000 Encounter for general adult medical examination without abnormal findings: Secondary | ICD-10-CM

## 2016-02-04 DIAGNOSIS — Z01419 Encounter for gynecological examination (general) (routine) without abnormal findings: Secondary | ICD-10-CM | POA: Diagnosis not present

## 2016-02-04 DIAGNOSIS — Z124 Encounter for screening for malignant neoplasm of cervix: Secondary | ICD-10-CM | POA: Diagnosis not present

## 2016-02-04 LAB — POCT URINALYSIS DIPSTICK
Bilirubin, UA: NEGATIVE
Blood, UA: NEGATIVE
GLUCOSE UA: NEGATIVE
Ketones, UA: NEGATIVE
LEUKOCYTES UA: NEGATIVE
NITRITE UA: NEGATIVE
PROTEIN UA: NEGATIVE
UROBILINOGEN UA: NEGATIVE
pH, UA: 6

## 2016-02-04 NOTE — Progress Notes (Signed)
60 y.o. [id number] MarriedCaucasianF here for annual exam.  Doing well.  No vaginal bleeding.  Off micronor.  FSH 12/15 >100.  Pt feeling really good.  Denies hot flashes or night sweats.  Sleep is not as good but this is very manageable.    Has a place on her chest she wants me to look at today.  PCP:  Dr. Kevan NyGates.  Pt reports blood work done with Dr. Kevan NyGates within the last year.     Patient's last menstrual period was 08/10/2014.          Sexually active: Yes.    The current method of family planning is vasectomy.    Exercising: Yes.    Walking Smoker:  no  Health Maintenance: Pap:  09/09/13 Neg. HR HPV:neg History of abnormal Pap:  yes MMG:  08/28/14 BIRADS1:neg Colonoscopy:  2013 repeat 10 years  BMD:   Never TDaP:  unsure Screening Labs: PCP, Urine today: pending   reports that she has never smoked. She has never used smokeless tobacco. She reports that she drinks about 1.8 - 2.4 oz of alcohol per week. She reports that she does not use illicit drugs.  Past Medical History  Diagnosis Date  . Hypertension   . Infertility, female   . Abnormal Pap smear   . SVD (spontaneous vaginal delivery)     x 2  . Arthritis     left shoulder   . Asthmatic bronchitis     12/15    Past Surgical History  Procedure Laterality Date  . Vaginal wall repair      after delivery  . Cryotherapy      for abnormal pap  . Dilatation & curettage/hysteroscopy with trueclear N/A 11/16/2013    Procedure: DILATATION & CURETTAGE/HYSTEROSCOPY WITH TRUCLEAR;  Surgeon: Bennye Almracy H Lathrop, MD;  Location: [facility];  Service: Gynecology;  Laterality: N/A;    Current Outpatient Prescriptions  Medication Sig Dispense Refill  . losartan (COZAAR) 50 MG tablet Take 1 tablet by mouth daily.    . Multiple Vitamins-Minerals (MULTIVITAMIN PO) Take by mouth as needed.     No current facility-administered medications for this visit.    Family History  Problem Relation Age of Onset  . Cancer Mother     lung  .  Hypertension Mother   . Diabetes Brother   . Heart disease Brother   . Cancer Other     leg  . Cancer Brother     lung    ROS:  Pertinent items are noted in HPI.  Otherwise, a comprehensive ROS was negative.  Exam:   BP 118/80 mmHg  Pulse 62  Resp 18  Ht 5' 3.25" (1.607 m)  Wt 165 lb (74.844 kg)  BMI 28.98 kg/m2  LMP 08/10/2014  Weight change: +4#   Height: 5' 3.25" (160.7 cm)  Ht Readings from Last 3 Encounters:  02/04/16 5' 3.25" (1.607 m)  03/30/14 5' 3.25" (1.607 m)  03/13/14 5' 3.25" (1.607 m)    General appearance: alert, cooperative and appears stated age Head: Normocephalic, without obvious abnormality, atraumatic Neck: no adenopathy, supple, symmetrical, trachea midline and thyroid normal to inspection and palpation Lungs: clear to auscultation bilaterally Breasts: normal appearance, no masses or tenderness Heart: regular rate and rhythm Abdomen: soft, non-tender; bowel sounds normal; no masses,  no organomegaly Extremities: extremities normal, atraumatic, no cyanosis or edema Skin:  Lesion on chest is rough and raised.  Not pigmented.   Lymph nodes: Cervical, supraclavicular, and axillary nodes normal. No abnormal  inguinal nodes palpated Neurologic: Grossly normal   Pelvic: External genitalia:  no lesions              Urethra:  normal appearing urethra with no masses, tenderness or lesions              Bartholins and Skenes: normal                 Vagina: normal appearing vagina with normal color and discharge, no lesions              Cervix: no lesions              Pap taken: Yes.   Bimanual Exam:  Uterus:  normal size, contour, position, consistency, mobility, non-tender              Adnexa: normal adnexa and no mass, fullness, tenderness               Rectovaginal: Confirms               Anus:  normal sphincter tone, no lesions  Chaperone was present for exam.  A:  Well Woman with normal exam PMP, no HRT Hypertension Rough and raised lesion on  chest  P: Mammogram yearly. Pt aware she is overdue.  She will schedule.  Doing 3D. pap smear with neg HR HPV 2014. Pap today.. Pt will check with Dr. Kevan Ny about having Hep C testing done Tdap recommended as pt is not sure about when it was given.  Declines today Dermatology eval recommended.  Name and number given.   return annually or prn

## 2016-02-04 NOTE — Patient Instructions (Addendum)
Check with Dr. Kevan NyGates at your next visit about whether you've had a Hep C test.  If not, you should.  We can always do this in future as well.  [facility] [address], [place], [place] [address]  Phone: [phone]  Dr. Arta BruceSusan Steinhelfer

## 2016-02-06 LAB — IPS PAP TEST WITH REFLEX TO HPV

## 2016-02-07 ENCOUNTER — Other Ambulatory Visit: Payer: Self-pay

## 2016-02-07 DIAGNOSIS — Z1231 Encounter for screening mammogram for malignant neoplasm of breast: Secondary | ICD-10-CM

## 2016-03-03 ENCOUNTER — Ambulatory Visit
Admission: RE | Admit: 2016-03-03 | Discharge: 2016-03-03 | Disposition: A | Discharge status: Home / Self Care | Payer: BC Managed Care – PPO | Source: Ambulatory Visit

## 2016-03-03 DIAGNOSIS — Z1231 Encounter for screening mammogram for malignant neoplasm of breast: Secondary | ICD-10-CM

## 2017-04-20 ENCOUNTER — Other Ambulatory Visit: Payer: Self-pay | Admitting: Family Medicine

## 2017-04-20 DIAGNOSIS — Z1231 Encounter for screening mammogram for malignant neoplasm of breast: Secondary | ICD-10-CM

## 2017-05-14 NOTE — Progress Notes (Signed)
61 y.o. [id number] MarriedCaucasianF here for annual exam.  Doing well.  Denies vaginal bleeding.    PCP:  Shaune Pollackonna Gates.  Will have blood work in six months.    Patient's last menstrual period was 08/10/2014.          Sexually active: Yes.    The current method of family planning is vasectomy.    Exercising: Yes.    walking Smoker:  no  Health Maintenance: Pap:  02/04/16 negative,   09/09/13 negative, HR HPV negative  History of abnormal Pap:  Yes, remote hx MMG:  03/03/16 BIRADS:1 negative. Has appt today  Colonoscopy:  2013 repeat 10 years BMD:   None TDaP:  Unsure.  Pt will check with Dr. Kevan NyGates about this.  Pneumonia vaccine(s): No Zostavax: No Hep C testing: Will check with PCP regarding this as well.  Pt declines having this tested today Screening Labs: PCP   reports that she has never smoked. She has never used smokeless tobacco. She reports that she drinks about 1.8 - 2.4 oz of alcohol per week . She reports that she does not use drugs.  Past Medical History:  Diagnosis Date  . Abnormal Pap smear   . Arthritis    left shoulder   . Asthmatic bronchitis    12/15  . Hypertension   . Infertility, female   . SVD (spontaneous vaginal delivery)    x 2    Past Surgical History:  Procedure Laterality Date  . CRYOTHERAPY     for abnormal pap  . DILATATION & CURETTAGE/HYSTEROSCOPY WITH TRUECLEAR N/A 11/16/2013   Procedure: DILATATION & CURETTAGE/HYSTEROSCOPY WITH TRUCLEAR;  Surgeon: Bennye Almracy H Lathrop, MD;  Location: [facility];  Service: Gynecology;  Laterality: N/A;  . vaginal wall repair     after delivery    Current Outpatient Prescriptions  Medication Sig Dispense Refill  . losartan-hydrochlorothiazide (HYZAAR) 50-12.5 MG tablet Take 1 tablet by mouth daily.    . Multiple Vitamins-Minerals (MULTIVITAMIN PO) Take by mouth as needed.     No current facility-administered medications for this visit.     Family History  Problem Relation Age of Onset  . Cancer Other        leg   . Cancer Mother        lung  . Hypertension Mother   . Diabetes Brother   . Heart disease Brother   . Cancer Brother        lung    ROS:  Pertinent items are noted in HPI.  Otherwise, a comprehensive ROS was negative.  Exam:   BP 128/72 (BP Location: Right Arm, Patient Position: Sitting, Cuff Size: Normal)   Pulse 86   Resp 16   Ht 5\' 3"  (1.6 m)   Wt 167 lb (75.8 kg)   LMP 08/10/2014   BMI 29.58 kg/m   Weight change: +2#  Height: 5\' 3"  (160 cm)  Ht Readings from Last 3 Encounters:  05/15/17 5\' 3"  (1.6 m)  02/04/16 5' 3.25" (1.607 m)  03/30/14 5' 3.25" (1.607 m)    General appearance: alert, cooperative and appears stated age Head: Normocephalic, without obvious abnormality, atraumatic Neck: no adenopathy, supple, symmetrical, trachea midline and thyroid normal to inspection and palpation Lungs: clear to auscultation bilaterally Breasts: normal appearance, no masses or tenderness Heart: regular rate and rhythm Abdomen: soft, non-tender; bowel sounds normal; no masses,  no organomegaly Extremities: extremities normal, atraumatic, no cyanosis or edema Skin: Skin color, texture, turgor normal. No rashes or lesions Lymph nodes: Cervical,  supraclavicular, and axillary nodes normal. No abnormal inguinal nodes palpated Neurologic: Grossly normal   Pelvic: External genitalia:  no lesions              Urethra:  normal appearing urethra with no masses, tenderness or lesions              Bartholins and Skenes: normal                 Vagina: normal appearing vagina with normal color and discharge, no lesions              Cervix: no lesions              Pap taken: Yes.   Bimanual Exam:  Uterus:  normal size, contour, position, consistency, mobility, non-tender              Adnexa: normal adnexa and no mass, fullness, tenderness               Rectovaginal: Confirms               Anus:  normal sphincter tone, no lesions  Chaperone was present for exam.  A:  Well Woman with  normal exam PMP, no HRT Hypertension   P:   Mammogram guidelines reviewed.  Pt doing 3D MMGs due to breast density pap smear and HR HPV obtained today Pt will do Hep C with next lab work with Dr. Kevan Ny if this is due She will check about the date of her last Tdap return annually or prn

## 2017-05-15 ENCOUNTER — Ambulatory Visit: Payer: BC Managed Care – PPO | Admitting: Obstetrics & Gynecology

## 2017-05-15 ENCOUNTER — Encounter: Payer: Self-pay | Admitting: Obstetrics & Gynecology

## 2017-05-15 ENCOUNTER — Other Ambulatory Visit (HOSPITAL_COMMUNITY)
Admission: RE | Admit: 2017-05-15 | Discharge: 2017-05-15 | Disposition: A | Discharge status: Home / Self Care | Payer: BC Managed Care – PPO | Source: Ambulatory Visit | Attending: Obstetrics & Gynecology | Admitting: Obstetrics & Gynecology

## 2017-05-15 ENCOUNTER — Ambulatory Visit
Admission: RE | Admit: 2017-05-15 | Discharge: 2017-05-15 | Disposition: A | Discharge status: Home / Self Care | Payer: BC Managed Care – PPO | Source: Ambulatory Visit | Attending: Family Medicine | Admitting: Family Medicine

## 2017-05-15 VITALS — BP 128/72 | HR 86 | Resp 16 | Ht 63.0 in | Wt 167.0 lb

## 2017-05-15 DIAGNOSIS — Z124 Encounter for screening for malignant neoplasm of cervix: Secondary | ICD-10-CM | POA: Insufficient documentation

## 2017-05-15 DIAGNOSIS — Z1231 Encounter for screening mammogram for malignant neoplasm of breast: Secondary | ICD-10-CM

## 2017-05-15 DIAGNOSIS — Z01419 Encounter for gynecological examination (general) (routine) without abnormal findings: Secondary | ICD-10-CM | POA: Diagnosis not present

## 2017-05-15 NOTE — Patient Instructions (Signed)
Double check about your last tetanus shot with Dr. Kevan NyGates and if you have done the Hep C testing.

## 2017-05-18 LAB — CYTOLOGY - PAP
Diagnosis: NEGATIVE
HPV (WINDOPATH): NOT DETECTED

## 2018-04-29 ENCOUNTER — Other Ambulatory Visit: Payer: Self-pay | Admitting: Family Medicine

## 2018-04-29 DIAGNOSIS — Z1231 Encounter for screening mammogram for malignant neoplasm of breast: Secondary | ICD-10-CM

## 2018-05-21 ENCOUNTER — Ambulatory Visit: Payer: BC Managed Care – PPO

## 2018-07-27 ENCOUNTER — Ambulatory Visit: Payer: BC Managed Care – PPO

## 2018-07-27 ENCOUNTER — Other Ambulatory Visit: Payer: Self-pay

## 2018-07-27 ENCOUNTER — Encounter: Payer: Self-pay | Admitting: Obstetrics & Gynecology

## 2018-07-27 ENCOUNTER — Ambulatory Visit: Payer: BC Managed Care – PPO | Admitting: Obstetrics & Gynecology

## 2018-07-27 VITALS — BP 112/70 | HR 80 | Resp 16 | Ht 63.25 in | Wt 162.0 lb

## 2018-07-27 DIAGNOSIS — Z01419 Encounter for gynecological examination (general) (routine) without abnormal findings: Secondary | ICD-10-CM | POA: Diagnosis not present

## 2018-07-27 NOTE — Progress Notes (Signed)
62 y.o. [id number] Married White or Caucasian female here for annual exam. Doing well.  Saw Dr. Shaune Pollackonna Gates this summer.  Did blood work this summer and this was normal, per her report.  Dr. Kevan NyGates is retiring in December.  Denies vaginal bleeding.      Patient's last menstrual period was 08/10/2014.          Sexually active: Yes.    The current method of family planning is vasectomy.    Exercising: Yes.    walking Smoker:  no  Health Maintenance: Pap:  05/15/17 neg. HR HPV:neg  02/04/16 neg  History of abnormal Pap:  Yes, remote hx MMG:  05/15/17 BIRADS1:Neg.  Pt aware this is due.   Colonoscopy:  2013 f/u 10 years  BMD:   Never TDaP:  Unsure.  Declines today. Pneumonia vaccine(s):  No Shingrix:   No Hep C testing: With PCP Screening Labs: PCP   reports that she has never smoked. She has never used smokeless tobacco. She reports that she drinks about 4.0 - 5.0 standard drinks of alcohol per week. She reports that she does not use drugs.  Past Medical History:  Diagnosis Date  . Abnormal Pap smear   . Arthritis    left shoulder   . Asthmatic bronchitis    12/15  . Hypertension   . Infertility, female     Past Surgical History:  Procedure Laterality Date  . CRYOTHERAPY     for abnormal pap  . DILATATION & CURETTAGE/HYSTEROSCOPY WITH TRUECLEAR N/A 11/16/2013   Procedure: DILATATION & CURETTAGE/HYSTEROSCOPY WITH TRUCLEAR;  Surgeon: Bennye Almracy H Lathrop, MD;  Location: [facility];  Service: Gynecology;  Laterality: N/A;  . vaginal wall repair     after delivery    Current Outpatient Medications  Medication Sig Dispense Refill  . losartan-hydrochlorothiazide (HYZAAR) 50-12.5 MG tablet Take 1 tablet by mouth daily.    . Multiple Vitamins-Minerals (MULTIVITAMIN PO) Take by mouth as needed.     No current facility-administered medications for this visit.     Family History  Problem Relation Age of Onset  . Cancer Other        leg  . Cancer Mother        lung  . Hypertension Mother   .  Diabetes Brother   . Heart disease Brother   . Cancer Brother        lung    Review of Systems  All other systems reviewed and are negative.   Exam:   BP 112/70 (BP Location: Right Arm, Patient Position: Sitting, Cuff Size: Normal)   Pulse 80   Resp 16   Ht 5' 3.25" (1.607 m)   Wt 162 lb (73.5 kg)   LMP 08/10/2014   BMI 28.47 kg/m     Height: 5' 3.25" (160.7 cm)  Ht Readings from Last 3 Encounters:  07/27/18 5' 3.25" (1.607 m)  05/15/17 5\' 3"  (1.6 m)  02/04/16 5' 3.25" (1.607 m)    General appearance: alert, cooperative and appears stated age Head: Normocephalic, without obvious abnormality, atraumatic Neck: no adenopathy, supple, symmetrical, trachea midline and thyroid normal to inspection and palpation Lungs: clear to auscultation bilaterally Breasts: normal appearance, no masses or tenderness Heart: regular rate and rhythm Abdomen: soft, non-tender; bowel sounds normal; no masses,  no organomegaly Extremities: extremities normal, atraumatic, no cyanosis or edema Skin: Skin color, texture, turgor normal. No rashes or lesions Lymph nodes: Cervical, supraclavicular, and axillary nodes normal. No abnormal inguinal nodes palpated Neurologic: Grossly normal  Pelvic: External genitalia:  no lesions              Urethra:  normal appearing urethra with no masses, tenderness or lesions              Bartholins and Skenes: normal                 Vagina: normal appearing vagina with normal color and discharge, no lesions              Cervix: no lesions              Pap taken: No. Bimanual Exam:  Uterus:  normal size, contour, position, consistency, mobility, non-tender              Adnexa: normal adnexa and no mass, fullness, tenderness               Rectovaginal: Confirms               Anus:  normal sphincter tone, no lesions  Chaperone was present for exam.  A:  Well Woman with normal exam PMP, no HRT Hypertension  P:   Mammogram guidelines reviewed.  Pt aware this  is due.  States she will do this pap smear with neg HR HPV 2018.  Not indicated today. Release of records for lab work from Dr. Kevan Ny to see if Hep C testing has been done D/w pt Shigrix vaccination.  She is going to check insurance coverage for her. Return annually or prn

## 2018-08-12 ENCOUNTER — Telehealth: Payer: Self-pay | Admitting: *Deleted

## 2018-08-12 NOTE — Telephone Encounter (Signed)
-----   Message from Jerene Bears, MD sent at 08/12/2018  8:39 AM EDT ----- Regarding: Hep C testing Could you please let this pt know that I got lab work results from Dr. Kevan Ny but there was no Hep C testing done.  I do think she should do this.  Ok to do it with blood work in the future or return and have it done now.  Thanks.  Please document in phone note.  Thanks.  Candace [name]

## 2018-08-12 NOTE — Telephone Encounter (Signed)
Left voicemail with message below

## 2018-09-13 ENCOUNTER — Ambulatory Visit
Admission: RE | Admit: 2018-09-13 | Discharge: 2018-09-13 | Disposition: A | Discharge status: Home / Self Care | Payer: BC Managed Care – PPO | Source: Ambulatory Visit | Attending: Family Medicine | Admitting: Family Medicine

## 2018-09-13 DIAGNOSIS — Z1231 Encounter for screening mammogram for malignant neoplasm of breast: Secondary | ICD-10-CM

## 2019-09-21 ENCOUNTER — Other Ambulatory Visit: Payer: Self-pay | Admitting: Family Medicine

## 2019-09-21 DIAGNOSIS — Z1231 Encounter for screening mammogram for malignant neoplasm of breast: Secondary | ICD-10-CM

## 2019-11-14 ENCOUNTER — Ambulatory Visit
Admission: RE | Admit: 2019-11-14 | Discharge: 2019-11-14 | Disposition: A | Discharge status: Home / Self Care | Payer: BC Managed Care – PPO | Source: Ambulatory Visit | Attending: Family Medicine | Admitting: Family Medicine

## 2019-11-14 ENCOUNTER — Other Ambulatory Visit: Payer: Self-pay

## 2019-11-14 DIAGNOSIS — Z1231 Encounter for screening mammogram for malignant neoplasm of breast: Secondary | ICD-10-CM

## 2019-11-29 ENCOUNTER — Encounter: Payer: Self-pay | Admitting: Obstetrics & Gynecology

## 2019-11-29 ENCOUNTER — Other Ambulatory Visit: Payer: Self-pay

## 2019-11-29 ENCOUNTER — Ambulatory Visit: Payer: BC Managed Care – PPO | Admitting: Obstetrics & Gynecology

## 2019-11-29 VITALS — BP 136/74 | HR 76 | Temp 97.0°F | Resp 10 | Ht 63.0 in | Wt 168.0 lb

## 2019-11-29 DIAGNOSIS — Z01419 Encounter for gynecological examination (general) (routine) without abnormal findings: Secondary | ICD-10-CM | POA: Diagnosis not present

## 2019-11-29 DIAGNOSIS — R3915 Urgency of urination: Secondary | ICD-10-CM | POA: Diagnosis not present

## 2019-11-29 LAB — POCT URINALYSIS DIPSTICK
Bilirubin, UA: NEGATIVE
Blood, UA: NEGATIVE
Glucose, UA: NEGATIVE
Ketones, UA: NEGATIVE
Leukocytes, UA: NEGATIVE
Nitrite, UA: NEGATIVE
Protein, UA: NEGATIVE
Urobilinogen, UA: 0.2 E.U./dL
pH, UA: 5 (ref 5.0–8.0)

## 2019-11-29 NOTE — Progress Notes (Signed)
64 y.o. [name] Married White or Caucasian female here for annual exam.  Finding Covid very wearing.  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  There is so much more "work" to do.  She is at [employer].    Denies vaginal bleeding.      PCP:  Dr. Mannie Stabile.  Did not have any blood work.    Patient's last menstrual period was 08/10/2014.          Sexually active: Yes.    The current method of family planning is vasectomy.    Exercising: Yes.    walking Smoker:  no  Health Maintenance: Pap:  05/15/17 neg. HR HPV:neg             02/04/16 neg History of abnormal Pap:  Yes, remote hx MMG:  11/15/19 BIRADS 1 negative/density c Colonoscopy:  2013 f/u 10 years BMD:   never TDaP:  05/11/2019 Pneumonia vaccine(s):  n/a Shingrix:   never Hep C testing: Has donated blood Screening Labs: PCP   reports that she has never smoked. She has never used smokeless tobacco. She reports current alcohol use of about 4.0 - 5.0 standard drinks of alcohol per week. She reports that she does not use drugs.  Past Medical History:  Diagnosis Date  . Abnormal Pap smear   . Arthritis    left shoulder   . Asthmatic bronchitis    12/15  . Hypertension   . Infertility, female     Past Surgical History:  Procedure Laterality Date  . CRYOTHERAPY     for abnormal pap  . DILATATION & CURETTAGE/HYSTEROSCOPY WITH TRUECLEAR N/A 11/16/2013   Procedure: DILATATION & CURETTAGE/HYSTEROSCOPY WITH TRUCLEAR;  Surgeon: Azalia Bilis, MD;  Location: [facility];  Service: Gynecology;  Laterality: N/A;  . vaginal wall repair     after delivery    Current Outpatient Medications  Medication Sig Dispense Refill  . losartan-hydrochlorothiazide (HYZAAR) 50-12.5 MG tablet Take 1 tablet by mouth daily.    . Multiple Vitamins-Minerals (MULTIVITAMIN PO) Take by mouth as needed.     No current facility-administered medications for this visit.    Family History  Problem Relation Age of Onset  .  Cancer Other        leg  . Cancer Mother        lung  . Hypertension Mother   . Diabetes Brother   . Heart disease Brother   . Cancer Brother        lung    Review of Systems  Genitourinary: Positive for urgency.       Incontinence   All other systems reviewed and are negative.   Exam:   BP 136/74 (BP Location: Right Arm, Patient Position: Sitting, Cuff Size: Normal)   Pulse 76   Temp (!) 97 F (36.1 C) (Temporal)   Resp 10   Ht 5\' 3"  (1.6 m)   Wt 168 lb (76.2 kg)   LMP 08/10/2014   BMI 29.76 kg/m    Height: 5\' 3"  (160 cm)  Ht Readings from Last 3 Encounters:  11/29/19 5\' 3"  (1.6 m)  07/27/18 5' 3.25" (1.607 m)  05/15/17 5\' 3"  (1.6 m)   General appearance: alert, cooperative and appears stated age Head: Normocephalic, without obvious abnormality, atraumatic Neck: no adenopathy, supple, symmetrical, trachea midline and thyroid normal to inspection and palpation Lungs: clear to auscultation bilaterally Breasts: normal appearance, no masses or tenderness Heart: regular rate and rhythm Abdomen: soft, non-tender;  bowel sounds normal; no masses,  no organomegaly Extremities: extremities normal, atraumatic, no cyanosis or edema Skin: Skin color, texture, turgor normal. No rashes or lesions Lymph nodes: Cervical, supraclavicular, and axillary nodes normal. No abnormal inguinal nodes palpated Neurologic: Grossly normal  Pelvic: External genitalia:  no lesions              Urethra:  normal appearing urethra with no masses, tenderness or lesions              Bartholins and Skenes: normal                 Vagina: normal appearing vagina with normal color and discharge, no lesions              Cervix: no lesions              Pap taken: No. Bimanual Exam:  Uterus:  normal size, contour, position, consistency, mobility, non-tender              Adnexa: normal adnexa and no mass, fullness, tenderness               Rectovaginal: Confirms               Anus:  normal sphincter  tone, no lesions  Chaperone, Zenovia Jordan, CMA, was present for exam.  A:  Well Woman with normal exam PMP, no HRT Hypertension Urinary urgency with some mild incontinence  P:   Mammogram guidelines reviewed.  This was just done. pap smear with neg HR HPV 2018. Colonoscopy UTD Will have blood work with Dr. Tracie Harrier Plan BMD next year Shingrix vaccination discussed Kegel exercises and pelvic PT discussed Return annually or prn

## 2019-11-29 NOTE — Patient Instructions (Signed)
Kegel exercises When you are ready, I will refer you to pelvic PT Vaginal weights

## 2020-01-07 ENCOUNTER — Ambulatory Visit: Payer: BC Managed Care – PPO | Attending: Internal Medicine

## 2020-01-07 ENCOUNTER — Other Ambulatory Visit: Payer: Self-pay

## 2020-01-07 DIAGNOSIS — Z23 Encounter for immunization: Secondary | ICD-10-CM | POA: Insufficient documentation

## 2020-01-07 NOTE — Progress Notes (Signed)
   Covid-19 Vaccination Clinic  Name:  Candace [name]    MRN: [id number] DOB: 04-29-1956  01/07/2020  [name] was observed post Covid-19 immunization for 15 minutes without incidence. She was provided with Vaccine Information Sheet and instruction to access the V-Safe system.   [name] was instructed to call 911 with any severe reactions post vaccine: Marland Kitchen Difficulty breathing  . Swelling of your face and throat  . A fast heartbeat  . A bad rash all over your body  . Dizziness and weakness    Immunizations Administered    Name Date Dose VIS Date Route   Pfizer COVID-19 Vaccine 01/07/2020  1:23 PM 0.3 mL 10/21/2019 Intramuscular   Manufacturer: [employer], [employer]   Lot: [id number]   NDC: [address]-3

## 2020-01-28 ENCOUNTER — Ambulatory Visit: Payer: BC Managed Care – PPO | Attending: Internal Medicine

## 2020-01-28 DIAGNOSIS — Z23 Encounter for immunization: Secondary | ICD-10-CM

## 2020-01-28 NOTE — Progress Notes (Signed)
   Covid-19 Vaccination Clinic  Name:  Candace [name]    MRN: [id number] DOB: 1956-06-19  01/28/2020  [name] was observed post Covid-19 immunization for 15 minutes without incident. She was provided with Vaccine Information Sheet and instruction to access the V-Safe system.   [name] was instructed to call 911 with any severe reactions post vaccine: Marland Kitchen Difficulty breathing  . Swelling of face and throat  . A fast heartbeat  . A bad rash all over body  . Dizziness and weakness   Immunizations Administered    Name Date Dose VIS Date Route   Pfizer COVID-19 Vaccine 01/28/2020  1:07 PM 0.3 mL 10/21/2019 Intramuscular   Manufacturer: [employer], [employer]   Lot: [id number]   NDC: [address]-0

## 2020-04-16 ENCOUNTER — Telehealth: Payer: Self-pay

## 2020-04-16 DIAGNOSIS — M6289 Other specified disorders of muscle: Secondary | ICD-10-CM

## 2020-04-16 NOTE — Telephone Encounter (Signed)
Patient called regarding recommendation for bladder muscle control therapist. Patient states Dr. Hyacinth Meeker woulld refer her to the specialist.

## 2020-04-16 NOTE — Telephone Encounter (Signed)
Spoke with patient. Patient was seen in office for AEX 11/29/19, reported urinary urgency and mild incontinence. Kegel exercises and pelvic PT were discussed. Patient reports no change in symptoms, no new symptoms. Requesting referral for pelvic floor PT. Reviewed option for Pelvic floor PT, patient would like to check with her insurance provider to see who is in network before placing referral. Advised patient I will review with Dr. Hyacinth Meeker and return call. Patient will contact her insurance company.   Dr. Hyacinth Meeker -ok to proceed with referral to pelvic PT?

## 2020-04-18 NOTE — Telephone Encounter (Signed)
Left detailed message per DPR on pt's mobile number. Pt updated on placed referral and will wait for appt. Pt to return call to office with any additional questions or concerns.  Addendum closed.

## 2020-04-18 NOTE — Telephone Encounter (Signed)
Order placed for pelvic PT to [facility] at [facility] location.

## 2020-05-23 ENCOUNTER — Ambulatory Visit: Payer: BC Managed Care – PPO | Attending: Obstetrics & Gynecology | Admitting: Physical Therapy

## 2020-05-23 ENCOUNTER — Encounter: Payer: Self-pay | Admitting: Physical Therapy

## 2020-05-23 ENCOUNTER — Other Ambulatory Visit: Payer: Self-pay

## 2020-05-23 DIAGNOSIS — M6281 Muscle weakness (generalized): Secondary | ICD-10-CM

## 2020-05-23 DIAGNOSIS — R279 Unspecified lack of coordination: Secondary | ICD-10-CM | POA: Diagnosis present

## 2020-05-23 DIAGNOSIS — R252 Cramp and spasm: Secondary | ICD-10-CM

## 2020-05-23 NOTE — Therapy (Addendum)
[facility] [address] [place], [place], [address] Phone: [phone]   Fax:  [phone]  Physical Therapy Evaluation  Patient Details  Name: Candace [name] MRN: [id number] Date of Birth: 1956/09/03 Referring Provider (PT): Jerene Bears, MD   Encounter Date: 05/23/2020   PT End of Session - 05/28/20 1709    Visit Number 1    Date for PT Re-Evaluation 08/15/20    PT Start Time 1445    PT Stop Time 1540    PT Time Calculation (min) 55 min    Activity Tolerance Patient tolerated treatment well    Behavior During Therapy [facility] for tasks assessed/performed           Past Medical History:  Diagnosis Date  . Abnormal Pap smear   . Arthritis    left shoulder   . Asthmatic bronchitis    12/15  . Hypertension   . Infertility, female     Past Surgical History:  Procedure Laterality Date  . CRYOTHERAPY     for abnormal pap  . DILATATION & CURETTAGE/HYSTEROSCOPY WITH TRUECLEAR N/A 11/16/2013   Procedure: DILATATION & CURETTAGE/HYSTEROSCOPY WITH TRUCLEAR;  Surgeon: Bennye Alm, MD;  Location: [facility];  Service: Gynecology;  Laterality: N/A;  . vaginal wall repair     after delivery    There were no vitals filed for this visit.    Subjective Assessment - 05/28/20 1710    Subjective Pt states she has been having urge incontinence.  Pt has been JIC peeing and wearing #3 pad for a little leakage that occurs during the dayand sometimes there is no leakage but it is safety net.  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  The leakage only happened a couple of times but I have to drink less water,    Limitations Other (comment)   can happen at any time   Patient Stated Goals reduce frequency and no leakage    Currently in Pain? No/denies              [facility] PT Assessment - 05/28/20 0001      Assessment   Medical Diagnosis M62.89 (ICD-10-CM) - Pelvic floor dysfunction    Referring Provider (PT)  Jerene Bears, MD    Onset Date/Surgical Date --   about 1 year has become an issue   Prior Therapy No      Precautions   Precautions None      Balance Screen   Has the patient fallen in the past 6 months No      Home Environment   Living Environment Private residence    Living Arrangements Spouse/significant other      Prior Function   Level of Independence Independent    Vocation Full time employment    Vocation Requirements standing and walking      Cognition   Overall Cognitive Status Within Functional Limits for tasks assessed      PROM   Overall PROM Comments Rt hip ER/IR/flexion 25% limited      Strength   Overall Strength Comments 5/5 hip strength; slight core weakness based on ASLR getting a little easier with compression      Flexibility   Soft Tissue Assessment /Muscle Length yes    Hamstrings normal      Palpation   Palpation comment lumbar tight      Ambulation/Gait   Gait Pattern Within Functional Limits  Objective measurements completed on examination: See above findings.     Pelvic Floor Special Questions - 05/28/20 0001    Prior Pelvic/Prostate Exam Yes    Prior Pregnancies Yes    Number of Pregnancies 3   1 miscarriage   Number of Vaginal Deliveries 2   one very bad tear with a lot of blood loss   Currently Sexually Active No    Urinary Leakage Yes    How often 4/week;     Activities that cause leaking With strong urge    Urinary urgency Yes    Urinary frequency 1-2 hours    Fluid intake 8 glasses H2O; coffee 3-4    Pelvic Floor Internal Exam identity confirmed and infomred consent given t operform internal assess    Palpation scar tissue posterior wall    Strength weak squeeze, no lift    Strength # of reps --   3/10 sec   Strength # of seconds 5    Tone high                    PT Education - 05/23/20 1714    Education Details urge techniques and bladder irritants    Person(s) Educated  Patient    Methods Explanation;Handout    Comprehension Verbalized understanding            PT Short Term Goals - 05/28/20 1525      PT SHORT TERM GOAL #1   Title ind with initial HEP    Time 4    Period Weeks    Status New    Target Date 06/20/20      PT SHORT TERM GOAL #2   Title ind with urge techniques    Time 4    Period Weeks    Status New    Target Date 06/20/20             PT Long Term Goals - 05/28/20 1525      PT LONG TERM GOAL #1   Title ind with advanced HEP    Time 12    Period Weeks    Status New    Target Date 08/15/20      PT LONG TERM GOAL #2   Title pt will report she has stream of at least 8 seconds when she empties for improved bladder filling    Time 12    Period Weeks    Status New    Target Date 08/15/20      PT LONG TERM GOAL #3   Title pt will be able to confidently go without wearing a pad due to improved pelvic floor endurance    Time 12    Period Weeks    Status New    Target Date 08/15/20      PT LONG TERM GOAL #4   Title Pt will report ability to control urge and has at least 50% less urgency to go to the bathroom    Time 12    Period Weeks    Status New    Target Date 08/15/20      PT LONG TERM GOAL #5   Title Pt will be able to sustain anterior pelvic floor tension for at least 15 seconds for bladder control    Baseline 5    Time 12    Period Weeks    Status New    Target Date 08/15/20  Plan - 05/27/20 1441    Clinical Impression Statement Pt presents to skilled PT due to increased urge and frequency of micturation. Pt states this has been worsening and is diruptive.  Pt has very high tone of pelvic floor and difficutly relaxing.  She can only do 3 quick flicks in 10 seconds. Pt has weak 2/5 MMT only one time and then is 1/5 holding for 5 seconds on the first rep.  pt does have scar tissue palpated from tearing that occurredduring delivery of her first born.  Pt has some core and hip weakness  and muscle tension as mentioned above.  She will benefit from skilled PT to address impairments so she can improved bladder function    Personal Factors and Comorbidities Time since onset of injury/illness/exacerbation;Comorbidity 1    Comorbidities history traumatic delivery    Examination-Activity Limitations Toileting    Examination-Participation Restrictions Community Activity    Stability/Clinical Decision Making Stable/Uncomplicated    Clinical Decision Making Low    Rehab Potential Excellent    PT Frequency 2x / week    PT Duration 12 weeks    PT Treatment/Interventions ADLs/Self Care Home Management;Biofeedback;Electrical Stimulation;Cryotherapy;Moist Heat;Therapeutic activities;Therapeutic exercise;Neuromuscular re-education;Patient/family education;Manual techniques;Passive range of motion;Taping;Dry needling    PT Next Visit Plan find 1-2 exercises to get patient to feel the contract/relax of the pelvic floor muscles; f/u on urge and stretch with diaphragmatic breathing    Consulted and Agree with Plan of Care Patient           Patient will benefit from skilled therapeutic intervention in order to improve the following deficits and impairments:  Increased muscle spasms, Decreased strength, Decreased range of motion, Decreased coordination  Visit Diagnosis: Muscle weakness (generalized)  Unspecified lack of coordination  Cramp and spasm     Problem List Patient Active Problem List   Diagnosis Date Noted  . Asthmatic bronchitis 11/07/2014  . Hypertension 09/09/2013  . Menorrhagia 09/09/2013    Junious Silk, PT 05/28/2020, 5:16 PM  [facility] [address], [address] [place], [place], [address] Phone: [phone]   Fax:  [phone]  Name: Candace [name] MRN: [id number] Date of Birth: 1956-02-25

## 2020-05-23 NOTE — Patient Instructions (Signed)
Bladder Irritants  Certain foods and beverages can be irritating to the bladder.  Avoiding these irritants may decrease your symptoms of urinary urgency, frequency or bladder pain.  Even reducing your intake can help with your symptoms.  Not everyone is sensitive to all bladder irritants, so you may consider focusing on one irritant at a time, removing or reducing your intake of that irritant for 7-10 days to see if this change helps your symptoms.  Water intake is also very important.  Below is a list of bladder irritants.  Drinks: alcohol, carbonated beverages, caffeinated beverages such as coffee and tea, drinks with artificial sweeteners, citrus juices, apple juice, tomato juice  Foods: tomatoes and tomato based foods, spicy food, sugar and artificial sweeteners, vinegar, chocolate, raw onion, apples, citrus fruits, pineapple, cranberries, tomatoes, strawberries, plums, peaches, cantaloupe  Other: acidic urine (too concentrated) - see water intake info below  Substitutes you can try that are NOT irritating to the bladder: cooked onion, pears, papayas, sun-brewed decaf teas, watermelons, non-citrus herbal teas, apricots, kava and low-acid instant drinks (Postum).    WATER INTAKE: Remember to drink lots of water (aim for fluid intake of half your body weight with 2/3 of fluids being water).  You may be limiting fluids due to fear of leakage, but this can actually worsen urgency symptoms due to highly concentrated urine.  Water helps balance the pH of your urine so it doesn't become too acidic - acidic urine is a bladder irritant!   [facility] [address], [address] [place], [place] [address] Phone # [phone] Fax [phone]  

## 2020-05-28 NOTE — Addendum Note (Signed)
Addended by: Beatris Si on: 05/28/2020 05:18 PM   Modules accepted: Orders

## 2020-07-09 ENCOUNTER — Other Ambulatory Visit: Payer: Self-pay

## 2020-07-09 ENCOUNTER — Ambulatory Visit: Payer: BC Managed Care – PPO | Attending: Obstetrics & Gynecology | Admitting: Physical Therapy

## 2020-07-09 ENCOUNTER — Encounter: Payer: Self-pay | Admitting: Physical Therapy

## 2020-07-09 DIAGNOSIS — R252 Cramp and spasm: Secondary | ICD-10-CM

## 2020-07-09 DIAGNOSIS — R279 Unspecified lack of coordination: Secondary | ICD-10-CM | POA: Insufficient documentation

## 2020-07-09 DIAGNOSIS — M6281 Muscle weakness (generalized): Secondary | ICD-10-CM | POA: Diagnosis not present

## 2020-07-09 NOTE — Patient Instructions (Signed)
Access Code: [facility] URL: [email or web address] Date: 07/09/2020 Prepared by: Dwana Curd  Exercises Supine Hip Internal and External Rotation - 1 x daily - 7 x weekly - 10 reps - 1 sets - 5 sec hold Supine Figure 4 Piriformis Stretch - 1 x daily - 7 x weekly - 3 reps - 1 sets - 30 sec hold Child's Pose Stretch - 1 x daily - 7 x weekly - 3 sets - 10 reps Supine Pelvic Floor Stretch - 1 x daily - 7 x weekly - 3 reps - 1 sets - 30 sec hold Diaphragmatic Breathing in Child's Pose with Pelvic Floor Relaxation - 1 x daily - 7 x weekly - 5 reps - 1 sets - 30 sec hold Supine Pelvic Floor Contraction - 3 x daily - 7 x weekly - 10 reps - 1 sets - 3 sec hold Quadruped Pelvic Floor Contraction with Opposite Arm and Leg Lift - 1 x daily - 7 x weekly - 3 sets - 10 reps

## 2020-07-09 NOTE — Therapy (Signed)
[facility] [address] [place], [place], [address] Phone: [phone]   Fax:  [phone]  Physical Therapy Treatment  Patient Details  Name: Candace [name] MRN: [id number] Date of Birth: 1956/03/20 Referring Provider (PT): Jerene Bears, MD   Encounter Date: 07/09/2020   PT End of Session - 07/09/20 1652    Visit Number 2    Date for PT Re-Evaluation 08/15/20    PT Start Time 1534    PT Stop Time 1615    PT Time Calculation (min) 41 min    Activity Tolerance Patient tolerated treatment well    Behavior During Therapy [facility] for tasks assessed/performed           Past Medical History:  Diagnosis Date  . Abnormal Pap smear   . Arthritis    left shoulder   . Asthmatic bronchitis    12/15  . Hypertension   . Infertility, female     Past Surgical History:  Procedure Laterality Date  . CRYOTHERAPY     for abnormal pap  . DILATATION & CURETTAGE/HYSTEROSCOPY WITH TRUECLEAR N/A 11/16/2013   Procedure: DILATATION & CURETTAGE/HYSTEROSCOPY WITH TRUCLEAR;  Surgeon: Bennye Alm, MD;  Location: [facility];  Service: Gynecology;  Laterality: N/A;  . vaginal wall repair     after delivery    There were no vitals filed for this visit.   Subjective Assessment - 07/09/20 1537    Subjective I was focused on the urge techniques since last time.  I tried drinking more water and noticing what was triggering ugency. It seems like spicy food and alcohol are the worst for my bladder.    Patient Stated Goals reduce frequency and no leakage    Currently in Pain? No/denies                             [facility] Adult PT Treatment/Exercise - 07/09/20 0001      Neuro Re-ed    Neuro Re-ed Details  breathing and bulging; breathing and not holding breath during kegel      Exercises   Exercises Lumbar      Lumbar Exercises: Stretches   Double Knee to Chest Stretch 2 reps;30 seconds    Double Knee to Chest  Stretch Limitations happy baby     Lower Trunk Rotation 5 reps;10 seconds    Figure 4 Stretch 2 reps;30 seconds    Other Lumbar Stretch Exercise child pose      Lumbar Exercises: Seated   Sit to Stand 10 reps    Sit to Stand Limitations kegel with sit to stand      Lumbar Exercises: Supine   AB Set Limitations kegel with and without ball squeeze - 5 x 10 sec      Lumbar Exercises: Quadruped   Single Arm Raise Right;Left;5 reps    Other Quadruped Lumbar Exercises TrA and pelvic floor contraction                  PT Education - 07/09/20 1701    Education Details Access Code: [facility]    Person(s) Educated Patient    Methods Explanation;Demonstration;Tactile cues;Verbal cues;Handout    Comprehension Verbalized understanding;Returned demonstration            PT Short Term Goals - 07/09/20 1708      PT SHORT TERM GOAL #1   Title ind with initial HEP    Status On-going  PT SHORT TERM GOAL #2   Title ind with urge techniques    Status Achieved             PT Long Term Goals - 05/28/20 1525      PT LONG TERM GOAL #1   Title ind with advanced HEP    Time 12    Period Weeks    Status New    Target Date 08/15/20      PT LONG TERM GOAL #2   Title pt will report she has stream of at least 8 seconds when she empties for improved bladder filling    Time 12    Period Weeks    Status New    Target Date 08/15/20      PT LONG TERM GOAL #3   Title pt will be able to confidently go without wearing a pad due to improved pelvic floor endurance    Time 12    Period Weeks    Status New    Target Date 08/15/20      PT LONG TERM GOAL #4   Title Pt will report ability to control urge and has at least 50% less urgency to go to the bathroom    Time 12    Period Weeks    Status New    Target Date 08/15/20      PT LONG TERM GOAL #5   Title Pt will be able to sustain anterior pelvic floor tension for at least 15 seconds for bladder control    Baseline 5    Time  12    Period Weeks    Status New    Target Date 08/15/20                 Plan - 07/09/20 1659    Clinical Impression Statement Pt did well with initial HEP and information given at evaluation.  Pt was able to correctly perform kegel exercise to address weakness and given stretches with breathing as she has some difficulty relaxing after doing the kegel. Pt only at first treatment since eval and will benefit from skilled PT to continue per POC.    PT Treatment/Interventions ADLs/Self Care Home Management;Biofeedback;Electrical Stimulation;Cryotherapy;Moist Heat;Therapeutic activities;Therapeutic exercise;Neuromuscular re-education;Patient/family education;Manual techniques;Passive range of motion;Taping;Dry needling    PT Next Visit Plan biofeedback if needed and f/u on sit to stand and sitting on toilet    PT Home Exercise Plan Access Code: [facility]    Consulted and Agree with Plan of Care Patient           Patient will benefit from skilled therapeutic intervention in order to improve the following deficits and impairments:  Increased muscle spasms, Decreased strength, Decreased range of motion, Decreased coordination  Visit Diagnosis: Muscle weakness (generalized)  Unspecified lack of coordination  Cramp and spasm     Problem List Patient Active Problem List   Diagnosis Date Noted  . Asthmatic bronchitis 11/07/2014  . Hypertension 09/09/2013  . Menorrhagia 09/09/2013    Junious Silk, PT 07/09/2020, 5:11 PM  [facility] [address] [place], [place], [address] Phone: [phone]   Fax:  [phone]  Name: Candace [name] MRN: [id number] Date of Birth: 08/15/1956

## 2020-07-18 ENCOUNTER — Encounter: Payer: BC Managed Care – PPO | Admitting: Physical Therapy

## 2020-07-25 ENCOUNTER — Other Ambulatory Visit: Payer: Self-pay

## 2020-07-25 ENCOUNTER — Ambulatory Visit: Payer: BC Managed Care – PPO | Attending: Obstetrics & Gynecology | Admitting: Physical Therapy

## 2020-07-25 ENCOUNTER — Encounter: Payer: Self-pay | Admitting: Physical Therapy

## 2020-07-25 DIAGNOSIS — M6281 Muscle weakness (generalized): Secondary | ICD-10-CM

## 2020-07-25 DIAGNOSIS — R279 Unspecified lack of coordination: Secondary | ICD-10-CM

## 2020-07-25 DIAGNOSIS — R252 Cramp and spasm: Secondary | ICD-10-CM | POA: Diagnosis present

## 2020-07-25 NOTE — Therapy (Addendum)
[facility] [address] [place], [place], [address] Phone: [phone]   Fax:  [phone]  Physical Therapy Treatment  Patient Details  Name: Nastasha Reising MRN: [id number] Date of Birth: [birth year] Referring Provider (PT): Megan Salon, MD   Encounter Date: 07/25/2020   PT End of Session - 07/25/20 1722    Visit Number 3    Date for PT Re-Evaluation 08/15/20    PT Start Time 9244    PT Stop Time 1616    PT Time Calculation (min) 43 min    Activity Tolerance Patient tolerated treatment well    Behavior During Therapy [facility] for tasks assessed/performed           Past Medical History:  Diagnosis Date  . Abnormal Pap smear   . Arthritis    left shoulder   . Asthmatic bronchitis    12/15  . Hypertension   . Infertility, female     Past Surgical History:  Procedure Laterality Date  . CRYOTHERAPY     for abnormal pap  . DILATATION & CURETTAGE/HYSTEROSCOPY WITH TRUECLEAR N/A 11/16/2013   Procedure: DILATATION & CURETTAGE/HYSTEROSCOPY WITH TRUCLEAR;  Surgeon: Azalia Bilis, MD;  Location: [facility];  Service: Gynecology;  Laterality: N/A;  . vaginal wall repair     after delivery    There were no vitals filed for this visit.   Subjective Assessment - 07/25/20 1729    Subjective Pt states she is doing better getting to the bathroom. Pt has limited budget and would like to try to come less frequently if possible.    Patient Stated Goals reduce frequency and no leakage    Currently in Pain? No/denies                             [facility] Adult PT Treatment/Exercise - 07/25/20 0001      Lumbar Exercises: Stretches   Other Lumbar Stretch Exercise child pose with side bend and rotation      Lumbar Exercises: Standing   Other Standing Lumbar Exercises hip abd, ext and side step with blue band - 10x each way      Lumbar Exercises: Supine   Bridge 5 seconds;10 reps    Bridge with [employer] 10 reps   8 sec     Lumbar Exercises: Quadruped   Other Quadruped Lumbar Exercises fire hydrants                    PT Short Term Goals - 07/09/20 1708      PT SHORT TERM GOAL #1   Title ind with initial HEP    Status On-going      PT SHORT TERM GOAL #2   Title ind with urge techniques    Status Achieved             PT Long Term Goals - 05/28/20 1525      PT LONG TERM GOAL #1   Title ind with advanced HEP    Time 12    Period Weeks    Status New    Target Date 08/15/20      PT LONG TERM GOAL #2   Title pt will report she has stream of at least 8 seconds when she empties for improved bladder filling    Time 12    Period Weeks    Status New    Target Date 08/15/20  PT LONG TERM GOAL #3   Title pt will be able to confidently go without wearing a pad due to improved pelvic floor endurance    Time 12    Period Weeks    Status New    Target Date 08/15/20      PT LONG TERM GOAL #4   Title Pt will report ability to control urge and has at least 50% less urgency to go to the bathroom    Time 12    Period Weeks    Status New    Target Date 08/15/20      PT LONG TERM GOAL #5   Title Pt will be able to sustain anterior pelvic floor tension for at least 15 seconds for bladder control    Baseline 5    Time 12    Period Weeks    Status New    Target Date 08/15/20                 Plan - 07/25/20 1725    Clinical Impression Statement Pt is doing very well with urge to void tecniques and reports being able to make it to the bathroom and wait a second to sit before voiding.  Pt needed a little encouragement to know that she is doing well with that part of the rehab and that continued practice will improve.  Overall, patient has good strength and awareness of the pelvic floor muscles.  She will benefit from working on the exercises as reviewed today for a couple of weeks and returning for f/u at that time.  Pt was able to understand how to  slowly progress holding the kegel longer so that she can increase her endurance.    PT Treatment/Interventions ADLs/Self Care Home Management;Biofeedback;Electrical Stimulation;Cryotherapy;Moist Heat;Therapeutic activities;Therapeutic exercise;Neuromuscular re-education;Patient/family education;Manual techniques;Passive range of motion;Taping;Dry needling    PT Next Visit Plan work on standing - single leg, anti rotation, f/u on questions with HEP    PT Home Exercise Plan Access Code: [facility]    Consulted and Agree with Plan of Care Patient           Patient will benefit from skilled therapeutic intervention in order to improve the following deficits and impairments:  Increased muscle spasms, Decreased strength, Decreased range of motion, Decreased coordination  Visit Diagnosis: Muscle weakness (generalized)  Unspecified lack of coordination  Cramp and spasm     Problem List Patient Active Problem List   Diagnosis Date Noted  . Asthmatic bronchitis 11/07/2014  . Hypertension 09/09/2013  . Menorrhagia 09/09/2013    Jule Ser, PT 07/25/2020, 5:35 PM  [facility] [address], [place], [place], [address] Phone: [phone]   Fax:  [phone]  Name: Inas Avena MRN: [id number] Date of Birth: December 04, 1955  PHYSICAL THERAPY DISCHARGE SUMMARY  Visits from Start of Care: 3  Current functional level related to goals / functional outcomes: See above goals   Remaining deficits: See above   Education / Equipment: HEP  Plan: Patient agrees to discharge.  Patient goals were not met. Patient is being discharged due to not returning since the last visit.  ?????    [employer], PT 09/03/20 9:49 AM

## 2020-08-01 ENCOUNTER — Encounter: Payer: BC Managed Care – PPO | Admitting: Physical Therapy

## 2020-08-08 ENCOUNTER — Encounter: Payer: BC Managed Care – PPO | Admitting: Physical Therapy

## 2020-08-15 ENCOUNTER — Encounter: Payer: BC Managed Care – PPO | Admitting: Physical Therapy

## 2020-08-22 ENCOUNTER — Encounter: Payer: BC Managed Care – PPO | Admitting: Physical Therapy

## 2020-08-29 ENCOUNTER — Encounter: Payer: BC Managed Care – PPO | Admitting: Physical Therapy

## 2020-09-05 ENCOUNTER — Encounter: Payer: BC Managed Care – PPO | Admitting: Physical Therapy

## 2020-11-06 ENCOUNTER — Other Ambulatory Visit: Payer: Self-pay | Admitting: Family Medicine

## 2020-11-06 DIAGNOSIS — Z1231 Encounter for screening mammogram for malignant neoplasm of breast: Secondary | ICD-10-CM

## 2020-12-19 ENCOUNTER — Ambulatory Visit: Payer: BC Managed Care – PPO

## 2021-01-31 ENCOUNTER — Other Ambulatory Visit: Payer: Self-pay

## 2021-01-31 ENCOUNTER — Ambulatory Visit
Admission: RE | Admit: 2021-01-31 | Discharge: 2021-01-31 | Disposition: A | Discharge status: Home / Self Care | Payer: BC Managed Care – PPO | Source: Ambulatory Visit | Attending: Family Medicine | Admitting: Family Medicine

## 2021-01-31 DIAGNOSIS — Z1231 Encounter for screening mammogram for malignant neoplasm of breast: Secondary | ICD-10-CM

## 2021-02-15 ENCOUNTER — Ambulatory Visit: Payer: BC Managed Care – PPO

## 2021-07-30 ENCOUNTER — Ambulatory Visit (HOSPITAL_BASED_OUTPATIENT_CLINIC_OR_DEPARTMENT_OTHER): Payer: BC Managed Care – PPO | Admitting: Obstetrics & Gynecology

## 2021-07-31 ENCOUNTER — Other Ambulatory Visit (HOSPITAL_COMMUNITY)
Admission: RE | Admit: 2021-07-31 | Discharge: 2021-07-31 | Disposition: A | Discharge status: Home / Self Care | Payer: BC Managed Care – PPO | Source: Ambulatory Visit | Attending: Obstetrics & Gynecology | Admitting: Obstetrics & Gynecology

## 2021-07-31 ENCOUNTER — Other Ambulatory Visit: Payer: Self-pay

## 2021-07-31 ENCOUNTER — Ambulatory Visit (INDEPENDENT_AMBULATORY_CARE_PROVIDER_SITE_OTHER): Payer: BC Managed Care – PPO | Admitting: Obstetrics & Gynecology

## 2021-07-31 ENCOUNTER — Encounter (HOSPITAL_BASED_OUTPATIENT_CLINIC_OR_DEPARTMENT_OTHER): Payer: Self-pay | Admitting: Obstetrics & Gynecology

## 2021-07-31 VITALS — BP 142/82 | HR 75 | Ht 63.0 in | Wt 159.6 lb

## 2021-07-31 DIAGNOSIS — Z124 Encounter for screening for malignant neoplasm of cervix: Secondary | ICD-10-CM | POA: Diagnosis present

## 2021-07-31 DIAGNOSIS — Z01419 Encounter for gynecological examination (general) (routine) without abnormal findings: Secondary | ICD-10-CM

## 2021-07-31 DIAGNOSIS — Z87898 Personal history of other specified conditions: Secondary | ICD-10-CM

## 2021-07-31 DIAGNOSIS — Z1159 Encounter for screening for other viral diseases: Secondary | ICD-10-CM

## 2021-07-31 DIAGNOSIS — E2839 Other primary ovarian failure: Secondary | ICD-10-CM

## 2021-07-31 DIAGNOSIS — Z780 Asymptomatic menopausal state: Secondary | ICD-10-CM | POA: Diagnosis not present

## 2021-07-31 NOTE — Progress Notes (Signed)
65 y.o. [id number] Married White or Caucasian female here for annual exam.  Doing well.  Still teaching ESL.    Has lost 10 pounds.    Denies vaginal bleeding.  Did pelvic PT this past year for urinary issues.  Also learned some some tricks for when she has some urinary leakage.  Didn't do very many PT sessions but felt she learned some good tips.    Patient's last menstrual period was 08/10/2014.          Sexually active: Yes.    The current method of family planning is vasectomy.    Exercising: Yes.     walking Smoker:  no  Health Maintenance: Pap:  05/15/2017 Negative History of abnormal Pap:  yes, remote hx MMG:  01/31/2021 Negative Colonoscopy:  2013 follow up next year BMD:   order placed Screening Labs: does with Dr. Tracie Harrier.  Was seen in April, 2022.   reports that she has never smoked. She has never used smokeless tobacco. She reports current alcohol use of about 4.0 - 5.0 standard drinks per week. She reports that she does not use drugs.  Past Medical History:  Diagnosis Date   Abnormal Pap smear    Arthritis    left shoulder    Asthmatic bronchitis    12/15   Hypertension    Infertility, female     Past Surgical History:  Procedure Laterality Date   CRYOTHERAPY     for abnormal pap   DILATATION & CURETTAGE/HYSTEROSCOPY WITH TRUECLEAR N/A 11/16/2013   Procedure: DILATATION & CURETTAGE/HYSTEROSCOPY WITH TRUCLEAR;  Surgeon: Bennye Alm, MD;  Location: [facility];  Service: Gynecology;  Laterality: N/A;   vaginal wall repair     after delivery    Current Outpatient Medications  Medication Sig Dispense Refill   losartan-hydrochlorothiazide (HYZAAR) 50-12.5 MG tablet Take 1 tablet by mouth daily.     Multiple Vitamins-Minerals (MULTIVITAMIN PO) Take by mouth as needed.     No current facility-administered medications for this visit.    Family History  Problem Relation Age of Onset   Cancer Other        leg   Cancer Mother        lung   Hypertension Mother     Diabetes Brother    Heart disease Brother    Cancer Brother        lung    Review of Systems  All other systems reviewed and are negative.  Exam:   BP (!) 142/82 (BP Location: Right Arm, Patient Position: Sitting, Cuff Size: Large)   Pulse 75   Ht 5\' 3"  (1.6 m)   Wt 159 lb 9.6 oz (72.4 kg)   LMP 08/10/2014   BMI 28.27 kg/m   Height: 5\' 3"  (160 cm)  General appearance: alert, cooperative and appears stated age Head: Normocephalic, without obvious abnormality, atraumatic Neck: no adenopathy, supple, symmetrical, trachea midline and thyroid normal to inspection and palpation Lungs: clear to auscultation bilaterally Breasts: normal appearance, no masses or tenderness Heart: regular rate and rhythm Abdomen: soft, non-tender; bowel sounds normal; no masses,  no organomegaly Extremities: extremities normal, atraumatic, no cyanosis or edema Skin: Skin color, texture, turgor normal. No rashes or lesions Lymph nodes: Cervical, supraclavicular, and axillary nodes normal. No abnormal inguinal nodes palpated Neurologic: Grossly normal   Pelvic: External genitalia:  no lesions              Urethra:  normal appearing urethra with no masses, tenderness or lesions  Bartholins and Skenes: normal                 Vagina: normal appearing vagina with normal color and no discharge, no lesions              Cervix: no lesions              Pap taken: Yes.   Bimanual Exam:  Uterus:  normal size, contour, position, consistency, mobility, non-tender              Adnexa: normal adnexa and no mass, fullness, tenderness               Rectovaginal: Confirms               Anus:  normal sphincter tone, no lesions  Chaperone, Ina Homes, CMA, was present for exam.  Assessment/Plan: 1. Well woman exam with routine gynecological exam - Pap smear and HR HPV obtained today - Mammogram 01/2021 - Colonoscopy due next year - Bone mineral density ordered for next - lab work done done with PCP.   Discussed with pt having a Hep C test done with next lab work - vaccines reviewed/updated  2. Postmenopausal - No HRT  3. Hypoestrinism - DG BONE DENSITY (DXA); Future  4. History of urinary incontinence - Did pelvic PT last year

## 2021-08-05 LAB — CYTOLOGY - PAP
Comment: NEGATIVE
Diagnosis: NEGATIVE
High risk HPV: NEGATIVE

## 2022-01-31 ENCOUNTER — Other Ambulatory Visit: Payer: Self-pay | Admitting: Family Medicine

## 2022-01-31 DIAGNOSIS — Z1231 Encounter for screening mammogram for malignant neoplasm of breast: Secondary | ICD-10-CM

## 2022-02-07 ENCOUNTER — Other Ambulatory Visit: Payer: BC Managed Care – PPO

## 2022-03-04 ENCOUNTER — Ambulatory Visit
Admission: RE | Admit: 2022-03-04 | Discharge: 2022-03-04 | Disposition: A | Discharge status: Home / Self Care | Payer: BC Managed Care – PPO | Source: Ambulatory Visit | Attending: Family Medicine | Admitting: Family Medicine

## 2022-03-04 ENCOUNTER — Other Ambulatory Visit: Payer: BC Managed Care – PPO

## 2022-03-04 DIAGNOSIS — Z1231 Encounter for screening mammogram for malignant neoplasm of breast: Secondary | ICD-10-CM

## 2022-03-13 ENCOUNTER — Ambulatory Visit
Admission: RE | Admit: 2022-03-13 | Discharge: 2022-03-13 | Disposition: A | Discharge status: Home / Self Care | Payer: BC Managed Care – PPO | Source: Ambulatory Visit | Attending: Obstetrics & Gynecology | Admitting: Obstetrics & Gynecology

## 2022-03-13 DIAGNOSIS — E2839 Other primary ovarian failure: Secondary | ICD-10-CM

## 2022-08-13 ENCOUNTER — Ambulatory Visit (INDEPENDENT_AMBULATORY_CARE_PROVIDER_SITE_OTHER): Payer: BC Managed Care – PPO | Admitting: Obstetrics & Gynecology

## 2022-08-13 ENCOUNTER — Encounter (HOSPITAL_BASED_OUTPATIENT_CLINIC_OR_DEPARTMENT_OTHER): Payer: Self-pay | Admitting: Obstetrics & Gynecology

## 2022-08-13 VITALS — BP 144/85 | HR 86 | Ht 63.0 in | Wt 159.2 lb

## 2022-08-13 DIAGNOSIS — Z01419 Encounter for gynecological examination (general) (routine) without abnormal findings: Secondary | ICD-10-CM | POA: Diagnosis not present

## 2022-08-13 DIAGNOSIS — I10 Essential (primary) hypertension: Secondary | ICD-10-CM | POA: Diagnosis not present

## 2022-08-13 DIAGNOSIS — Z1211 Encounter for screening for malignant neoplasm of colon: Secondary | ICD-10-CM

## 2022-08-13 DIAGNOSIS — N393 Stress incontinence (female) (male): Secondary | ICD-10-CM

## 2022-08-13 NOTE — Progress Notes (Signed)
66 y.o. [id number] Married White or Caucasian female here for annual exam.  Denies vaginal bleeding.    Saw Dr. Tracie Harrier, PCP, a few weeks ago.  BP was elevated.  She is being treated.  Has follow up in about 4 weeks.  Will have blood work then.    Had three deaths in her family the last year.  This was a stressors but she's had a joy with her son getting married next August.  They are getting married in [place].    Denies vaginal bleeding.    Patient's last menstrual period was 08/10/2014.          Sexually active: Yes.    The current method of family planning is vasectomy.    Exercising: Yes.     walking Smoker:  no  Health Maintenance: Pap:  07/31/2021 Normal  History of abnormal Pap:  remote hx MMG:  03/04/2022 Negative Colonoscopy:  2013.  Declined this year but willing to do a cologuard.   BMD:   03/13/2022 Normal Screening Labs: has this scheduled   reports that she has never smoked. She has never used smokeless tobacco. She reports current alcohol use of about 4.0 - 5.0 standard drinks of alcohol per week. She reports that she does not use drugs.  Past Medical History:  Diagnosis Date   Abnormal Pap smear    Arthritis    left shoulder    Asthmatic bronchitis    12/15   Hypertension    Infertility, female     Past Surgical History:  Procedure Laterality Date   CRYOTHERAPY     for abnormal pap   DILATATION & CURETTAGE/HYSTEROSCOPY WITH TRUECLEAR N/A 11/16/2013   Procedure: DILATATION & CURETTAGE/HYSTEROSCOPY WITH TRUCLEAR;  Surgeon: Bennye Alm, MD;  Location: [facility];  Service: Gynecology;  Laterality: N/A;   vaginal wall repair     after delivery    Current Outpatient Medications  Medication Sig Dispense Refill   losartan-hydrochlorothiazide (HYZAAR) 50-12.5 MG tablet Take 1 tablet by mouth daily.     Multiple Vitamins-Minerals (MULTIVITAMIN PO) Take by mouth as needed.     No current facility-administered medications for this visit.    Family History   Problem Relation Age of Onset   Cancer Mother        lung   Hypertension Mother    Diabetes Brother    Heart disease Brother    Cancer Brother        lung   Cancer Other        leg   Breast cancer Neg Hx     ROS: Constitutional: negative Genitourinary:negative  Exam:   BP (!) 144/85 (BP Location: Right Arm, Patient Position: Sitting, Cuff Size: Large)   Pulse 86   Ht 5\' 3"  (1.6 m)   Wt 159 lb 3.2 oz (72.2 kg)   LMP 08/10/2014   BMI 28.20 kg/m   Height: 5\' 3"  (160 cm)  General appearance: alert, cooperative and appears stated age Head: Normocephalic, without obvious abnormality, atraumatic Neck: no adenopathy, supple, symmetrical, trachea midline and thyroid normal to inspection and palpation Lungs: clear to auscultation bilaterally Breasts: normal appearance, no masses or tenderness Heart: regular rate and rhythm Abdomen: soft, non-tender; bowel sounds normal; no masses,  no organomegaly Extremities: extremities normal, atraumatic, no cyanosis or edema Skin: Skin color, texture, turgor normal. No rashes or lesions Lymph nodes: Cervical, supraclavicular, and axillary nodes normal. No abnormal inguinal nodes palpated Neurologic: Grossly normal   Pelvic: External genitalia:  no  lesions              Urethra:  normal appearing urethra with no masses, tenderness or lesions              Bartholins and Skenes: normal                 Vagina: normal appearing vagina with normal color and no discharge, no lesions              Cervix: no lesions              Pap taken: No. Bimanual Exam:  Uterus:  normal size, contour, position, consistency, mobility, non-tender              Adnexa: normal adnexa and no mass, fullness, tenderness               Rectovaginal: Confirms               Anus:  normal sphincter tone, no lesions  Chaperone, Octaviano Batty, CMA, was present for exam.  Assessment/Plan: 1. Well woman exam with routine gynecological exam - Pap smear neg 2022 -  Mammogram 01/31/2022 - Colonoscopy 2013.  Declines this year.  Willing to do cologuard - Bone mineral density 2023.  Normal. - lab work done done with PCP - vaccines reviewed/updated  2. Colon cancer screening - Cologuard  3. Primary hypertension - on anti-hypertensive  4.  Stress incontinence - Did pelvic PT last year.  Discussed referral to uro-gynecologist.  Declines referral now.

## 2022-08-31 LAB — COLOGUARD: COLOGUARD: NEGATIVE

## 2023-04-13 ENCOUNTER — Telehealth (HOSPITAL_BASED_OUTPATIENT_CLINIC_OR_DEPARTMENT_OTHER): Payer: Self-pay | Admitting: *Deleted

## 2023-04-13 ENCOUNTER — Other Ambulatory Visit: Payer: Self-pay | Admitting: Obstetrics & Gynecology

## 2023-04-13 DIAGNOSIS — Z1231 Encounter for screening mammogram for malignant neoplasm of breast: Secondary | ICD-10-CM

## 2023-04-13 NOTE — Telephone Encounter (Signed)
Pt left message on nurse line requesting referral to urogynecologist that she had discussed with Dr. Hyacinth Meeker at her last visit. Attempted to call pt back. LMOVM

## 2023-04-16 ENCOUNTER — Other Ambulatory Visit (HOSPITAL_BASED_OUTPATIENT_CLINIC_OR_DEPARTMENT_OTHER): Payer: Self-pay | Admitting: *Deleted

## 2023-04-16 DIAGNOSIS — N393 Stress incontinence (female) (male): Secondary | ICD-10-CM

## 2023-04-16 NOTE — Progress Notes (Signed)
Pt called requesting referral be placed to urogynecologist that she and Dr. Hyacinth Meeker discussed at her last visit to evaluate the stress urinary incontinence that she experiences. Referral placed. Advised that they should reach out to her for scheduling.

## 2023-05-07 ENCOUNTER — Ambulatory Visit
Admission: RE | Admit: 2023-05-07 | Discharge: 2023-05-07 | Disposition: A | Discharge status: Home / Self Care | Payer: BC Managed Care – PPO | Source: Ambulatory Visit | Attending: Obstetrics & Gynecology | Admitting: Obstetrics & Gynecology

## 2023-05-07 DIAGNOSIS — Z1231 Encounter for screening mammogram for malignant neoplasm of breast: Secondary | ICD-10-CM

## 2023-07-17 ENCOUNTER — Encounter: Payer: Self-pay | Admitting: Obstetrics and Gynecology

## 2023-07-17 ENCOUNTER — Ambulatory Visit (INDEPENDENT_AMBULATORY_CARE_PROVIDER_SITE_OTHER): Payer: BC Managed Care – PPO | Admitting: Obstetrics and Gynecology

## 2023-07-17 VITALS — BP 122/86 | HR 79 | Ht 62.5 in | Wt 162.0 lb

## 2023-07-17 DIAGNOSIS — N3281 Overactive bladder: Secondary | ICD-10-CM

## 2023-07-17 DIAGNOSIS — R350 Frequency of micturition: Secondary | ICD-10-CM | POA: Diagnosis not present

## 2023-07-17 LAB — POCT URINALYSIS DIPSTICK
Bilirubin, UA: NEGATIVE
Glucose, UA: NEGATIVE
Ketones, UA: NEGATIVE
Leukocytes, UA: NEGATIVE
Nitrite, UA: NEGATIVE
Protein, UA: NEGATIVE
Spec Grav, UA: >=1.03 — AB (ref 1.010–1.025)
Urobilinogen, UA: 0.2 U/dL
pH, UA: 7 (ref 5.0–8.0)

## 2023-07-17 NOTE — Patient Instructions (Addendum)
Today we talked about ways to manage bladder urgency such as altering your diet to avoid irritative beverages and foods (bladder diet) as well as attempting to decrease stress and other exacerbating factors.  Try to aim for 60oz of water per day.   The Most Bothersome Foods* The Least Bothersome Foods*  Coffee - Regular & Decaf Tea - caffeinated Carbonated beverages - cola, non-colas, diet & caffeine-free Alcohols - Beer, Red Wine, White Wine, [address] - Grapefruit, [place], Orange, [employer] - Cranberry, Grapefruit, Orange, Pineapple Vegetables - Tomato & Tomato Products Flavor Enhancers - Hot peppers, Spicy foods, Chili, Horseradish, Vinegar, Monosodium glutamate (MSG) Artificial Sweeteners - NutraSweet, Sweet 'N Low, Equal (sweetener), Saccharin Ethnic foods - [place], [place], [place] food [employer] - low-fat & whole Fruits - Bananas, Blueberries, Honeydew melon, Pears, Raisins, Watermelon Vegetables - Broccoli, [address] Sprouts, [place], Carrots, Cauliflower, [place], Cucumber, Mushrooms, Peas, Radishes, Squash, Zucchini, White potatoes, Sweet potatoes & yams Poultry - Chicken, Eggs, [place], [employer] - Beef, Diplomatic Services operational officer, Lamb Seafood - Shrimp, [place] fish, Salmon Grains - Oat, Rice Snacks - Pretzels, Popcorn  *Lenward Chancellor et al. Diet and its role in interstitial cystitis/bladder pain syndrome (IC/BPS) and comorbid conditions. [employer]. BJU Int. 2012 Jan 11.   We discussed the symptoms of overactive bladder (OAB), which include urinary urgency, urinary frequency, night-time urination, with or without urge incontinence.  We discussed management including behavioral therapy (decreasing bladder irritants by following a bladder diet, urge suppression strategies, timed voids, bladder retraining), physical therapy, medication; and for refractory cases posterior tibial nerve stimulation, sacral neuromodulation, and intravesical botulinum toxin injection.

## 2023-07-17 NOTE — Progress Notes (Signed)
[facility] New Patient Evaluation and Consultation  Referring Provider: Jerene Bears, MD PCP: Aliene Beams, MD Date of Service: 07/17/2023  SUBJECTIVE Chief Complaint: New Patient (Initial Visit) Candace KitchenSirita [name] is a 67 y.o. female complains of urinary incontinence.)  History of Present Illness: Candace [name] is a 67 y.o. White or Caucasian female seen in consultation at the request of Dr. Hyacinth Meeker for evaluation of incontinence.    Review of records significant for: Has incontinence and has tried pelvic PT in 2021.   Urinary Symptoms: Leaks urine with with movement to the bathroom, with urgency, and without sensation Leaks 3-5 time(s) per day. Sometimes she is not aware when she has leaked.  Denies leakage with stress.  Pad use: 1-2 pads per day.   She is bothered by her UI symptoms. Prior treatment- pelvic PT  Day time voids 8-10.  Nocturia: 1-2 times per night to void. Voiding dysfunction: she empties her bladder well.  does not use a catheter to empty bladder.  When urinating, she feels she has no difficulties Drinks: 6 cups coffee, a few glasses water per day, beer every other day  UTIs:  0  UTI's in the last year.   Denies history of blood in urine and kidney or bladder stones  Pelvic Organ Prolapse Symptoms:                  She Denies a feeling of a bulge the vaginal area.   Bowel Symptom: Bowel movements: 1 time(s) per day Stool consistency: soft  Straining: no.  Splinting: no.  Incomplete evacuation: no.  Denies accidental bowel leakage / fecal incontinence Bowel regimen: none   Sexual Function Sexually active: no.    Pelvic Pain Denies pelvic pain   Past Medical History:  Past Medical History:  Diagnosis Date   Abnormal Pap smear    Arthritis    left shoulder    Hypertension    Infertility, female      Past Surgical History:   Past Surgical History:  Procedure Laterality Date   CRYOTHERAPY     for abnormal pap    DILATATION & CURETTAGE/HYSTEROSCOPY WITH TRUECLEAR N/A 11/16/2013   Procedure: DILATATION & CURETTAGE/HYSTEROSCOPY WITH TRUCLEAR;  Surgeon: Bennye Alm, MD;  Location: [facility];  Service: Gynecology;  Laterality: N/A;   vaginal wall repair     after delivery     Past OB/GYN History: OB History  Gravida Para Term Preterm AB Living  3 2 2   1 2   SAB IAB Ectopic Multiple Live Births  1       2    # Outcome Date GA Lbr Len/2nd Weight Sex Type Anes PTL Lv  3 SAB           2 Term     M Vag-Spont   LIV  1 Term     M Vag-Spont   LIV   Menopausal: Yes, Denies vaginal bleeding since menopause Last pap smear was 2022- negative.     Medications: She has a current medication list which includes the following prescription(s): losartan-hydrochlorothiazide and multiple vitamin.   Allergies: Patient is allergic to lisinopril.   Social History:  Social History   Tobacco Use   Smoking status: Never   Smokeless tobacco: Never  Vaping Use   Vaping status: Never Used  Substance Use Topics   Alcohol use: Yes    Alcohol/week: 4.0 - 5.0 standard drinks of alcohol    Types: 4 - 5 Cans  of beer per week   Drug use: No    Relationship status: married She lives with husband.   She is employed as a Runner, broadcasting/film/video. Regular exercise: No History of abuse: No  Family History:   Family History  Problem Relation Age of Onset   Cancer Mother        lung   Hypertension Mother    Diabetes Brother    Heart disease Brother    Cancer Brother        lung   Cancer Other        leg   Breast cancer Neg Hx      Review of Systems: Review of Systems  Constitutional:  Negative for fever, malaise/fatigue and weight loss.  Respiratory:  Negative for cough, shortness of breath and wheezing.   Cardiovascular:  Negative for chest pain, palpitations and leg swelling.  Gastrointestinal:  Negative for abdominal pain and blood in stool.  Genitourinary:  Negative for dysuria.  Musculoskeletal:  Negative for  myalgias.  Skin:  Negative for rash.  Neurological:  Negative for dizziness and headaches.  Endo/Heme/Allergies:  Does not bruise/bleed easily.  Psychiatric/Behavioral:  Negative for depression. The patient is not nervous/anxious.      OBJECTIVE Physical Exam: Vitals:   07/17/23 1518  BP: 122/86  Pulse: 79  Weight: 162 lb (73.5 kg)  Height: 5' 2.5" (1.588 m)    Physical Exam Constitutional:      General: She is not in acute distress. Pulmonary:     Effort: Pulmonary effort is normal.  Abdominal:     General: There is no distension.     Palpations: Abdomen is soft.     Tenderness: There is no abdominal tenderness. There is no rebound.  Musculoskeletal:        General: No swelling. Normal range of motion.  Skin:    General: Skin is warm and dry.     Findings: No rash.  Neurological:     Mental Status: She is alert and oriented to person, place, and time.  Psychiatric:        Mood and Affect: Mood normal.        Behavior: Behavior normal.      GU / Detailed Urogynecologic Evaluation:  Pelvic Exam: Normal external female genitalia; Bartholin's and Skene's glands normal in appearance; urethral meatus normal in appearance, no urethral masses or discharge.   CST: negative  Speculum exam reveals normal vaginal mucosa with atrophy. Cervix normal appearance. Uterus normal single, nontender. Adnexa no mass, fullness, tenderness.     Pelvic floor strength III/V  Pelvic floor musculature: Right levator non-tender, Right obturator non-tender, Left levator non-tender, Left obturator non-tender  POP-Q:   POP-Q  -2.5                                            Aa   -2.5                                           Ba  -7.5  C   2                                            Gh  3.5                                            Pb  9                                            tvl   -2.5                                             Ap  -2.5                                            Bp  -9                                              D      Rectal Exam:  Normal external rectum  Post-Void Residual (PVR) by Bladder Scan: In order to evaluate bladder emptying, we discussed obtaining a postvoid residual and she agreed to this procedure.  Procedure: The ultrasound unit was placed on the patient's abdomen in the suprapubic region after the patient had voided. A PVR of 8 ml was obtained by bladder scan.  Laboratory Results: POC urine: trace blood, otherwise negative   ASSESSMENT AND PLAN Ms. Guerrera is a 67 y.o. with:  1. Overactive bladder   2. Urinary frequency    - We discussed the symptoms of overactive bladder (OAB), which include urinary urgency, urinary frequency, nocturia, with or without urge incontinence.  While we do not know the exact etiology of OAB, several treatment options exist. We discussed management including behavioral therapy (decreasing bladder irritants, urge suppression strategies, timed voids, bladder retraining), physical therapy, medication; for refractory cases posterior tibial nerve stimulation, sacral neuromodulation, and intravesical botulinum toxin injection.  -She is not sure she wants to start a medication at this time.  - First she will try reducing coffee intake and reincorporating some pelvic floor exercises. If she does not see improvement then we can discuss further options.   Return as needed   Marguerita Beards, MD

## 2023-08-17 ENCOUNTER — Ambulatory Visit (HOSPITAL_BASED_OUTPATIENT_CLINIC_OR_DEPARTMENT_OTHER): Payer: BC Managed Care – PPO | Admitting: Obstetrics & Gynecology

## 2023-09-14 ENCOUNTER — Ambulatory Visit (INDEPENDENT_AMBULATORY_CARE_PROVIDER_SITE_OTHER): Payer: BC Managed Care – PPO

## 2023-09-14 ENCOUNTER — Encounter: Payer: Self-pay | Admitting: Podiatry

## 2023-09-14 ENCOUNTER — Ambulatory Visit: Payer: BC Managed Care – PPO | Admitting: Podiatry

## 2023-09-14 VITALS — Ht 62.5 in | Wt 162.0 lb

## 2023-09-14 DIAGNOSIS — M205X2 Other deformities of toe(s) (acquired), left foot: Secondary | ICD-10-CM | POA: Diagnosis not present

## 2023-09-14 DIAGNOSIS — M21619 Bunion of unspecified foot: Secondary | ICD-10-CM | POA: Diagnosis not present

## 2023-09-14 DIAGNOSIS — M778 Other enthesopathies, not elsewhere classified: Secondary | ICD-10-CM | POA: Diagnosis not present

## 2023-09-16 NOTE — Progress Notes (Signed)
Subjective:   Patient ID: Candace [name], female   DOB: 67 y.o.   MRN: [id number]   HPI Patient presents concerned about tingling in her right second third fourth toes right foot and also does seem to have some arthritis around the big toe joint.  Patient does not smoke likes to be active   Review of Systems  All other systems reviewed and are negative.       Objective:  Physical Exam Vitals and nursing note reviewed.  Constitutional:      Appearance: She is well-developed.  Pulmonary:     Effort: Pulmonary effort is normal.  Musculoskeletal:        General: Normal range of motion.  Skin:    General: Skin is warm.  Neurological:     Mental Status: She is alert.     Neurovascular status was found to be intact muscle strength was found to be adequate range of motion adequate with mild tingling around the second third fourth toes but patient history of injury and it seems to be getting better with limited motion of the first MPJ with no crepitus of the joint only mild discomfort     Assessment:  Inflammatory capsulitis with probability for nerve contusion secondary to injury     Plan:  H&P reviewed both conditions do not recommend current treatment even though we could use aggressive conservative treatment for the left but organ to hold off and I do think the tingling will resolve but may take 6 months to a year  X-rays were negative for signs of fracture or arthritis around the lesser MPJs moderate arthritis big toe joint

## 2023-09-22 ENCOUNTER — Ambulatory Visit (HOSPITAL_BASED_OUTPATIENT_CLINIC_OR_DEPARTMENT_OTHER): Payer: BC Managed Care – PPO | Admitting: Obstetrics & Gynecology

## 2023-12-08 ENCOUNTER — Encounter (HOSPITAL_BASED_OUTPATIENT_CLINIC_OR_DEPARTMENT_OTHER): Payer: Self-pay | Admitting: Obstetrics & Gynecology

## 2023-12-08 ENCOUNTER — Ambulatory Visit (HOSPITAL_BASED_OUTPATIENT_CLINIC_OR_DEPARTMENT_OTHER): Payer: 59 | Admitting: Obstetrics & Gynecology

## 2023-12-08 VITALS — BP 130/82 | HR 70 | Ht 63.0 in | Wt 158.2 lb

## 2023-12-08 DIAGNOSIS — Z01419 Encounter for gynecological examination (general) (routine) without abnormal findings: Secondary | ICD-10-CM | POA: Diagnosis not present

## 2023-12-08 DIAGNOSIS — N3281 Overactive bladder: Secondary | ICD-10-CM | POA: Diagnosis not present

## 2023-12-08 DIAGNOSIS — I10 Essential (primary) hypertension: Secondary | ICD-10-CM

## 2023-12-08 DIAGNOSIS — Z780 Asymptomatic menopausal state: Secondary | ICD-10-CM | POA: Diagnosis not present

## 2023-12-08 NOTE — Progress Notes (Signed)
68 y.o. Candace [name] Married White or Caucasian female here for AEX.  Blood pressure was initially a little elevated.  Repeat was more normal.  She is monitoring at home and has follow up planned with Dr. Tracie Harrier.  She is on Hyzaar but this may need adjustment.    Lots of family stressors the last few weeks.    Denies vaginal bleeding.  She did see Dr. Florian Buff last year.  She has cut back her caffeine. Her symptoms are better.    Patient's last menstrual period was 08/10/2014.           Health Maintenance: PCP:  Dr. Tracie Harrier.    Vaccines are up to date:  yes except prevnar Colonoscopy:  2013, cologuard 08/2022 MMG:  05/07/2023 Negative BMD:  03/13/2022 Normal  Last pap smear:  07/31/2021 Negative.   H/o abnormal pap smear:  remote hx   reports that she has never smoked. She has never used smokeless tobacco. She reports current alcohol use of about 4.0 - 5.0 standard drinks of alcohol per week. She reports that she does not use drugs.  Past Medical History:  Diagnosis Date   Abnormal Pap smear    Arthritis    left shoulder    Hypertension    Infertility, female     Past Surgical History:  Procedure Laterality Date   CRYOTHERAPY     for abnormal pap   DILATATION & CURETTAGE/HYSTEROSCOPY WITH TRUECLEAR N/A 11/16/2013   Procedure: DILATATION & CURETTAGE/HYSTEROSCOPY WITH TRUCLEAR;  Surgeon: Bennye Alm, MD;  Location: [facility];  Service: Gynecology;  Laterality: N/A;   vaginal wall repair     after delivery    Current Outpatient Medications  Medication Sig Dispense Refill   losartan-hydrochlorothiazide (HYZAAR) 50-12.5 MG tablet Take 1 tablet by mouth daily.     Multiple Vitamins-Minerals (MULTIVITAMIN PO) Take by mouth as needed.     No current facility-administered medications for this visit.    Family History  Problem Relation Age of Onset   Cancer Mother        lung   Hypertension Mother    Diabetes Brother    Heart disease Brother    Cancer Brother        lung    Cancer Other        leg   Breast cancer Neg Hx     Review of Systems  Constitutional: Negative.   Genitourinary: Negative.     Exam:   BP (!) 143/90 (BP Location: Left Arm, Patient Position: Sitting)   Pulse 70   Ht 5\' 3"  (1.6 m)   Wt 158 lb 3.2 oz (71.8 kg)   LMP 08/10/2014   BMI 28.02 kg/m   Height: 5\' 3"  (160 cm)  General appearance: alert, cooperative and appears stated age Breasts: normal appearance, no masses or tenderness Abdomen: soft, non-tender; bowel sounds normal; no masses,  no organomegaly Lymph nodes: Cervical, supraclavicular, and axillary nodes normal.  No abnormal inguinal nodes palpated Neurologic: Grossly normal  Pelvic: External genitalia:  no lesions              Urethra:  normal appearing urethra with no masses, tenderness or lesions              Bartholins and Skenes: normal                 Vagina: normal appearing vagina with atrophic changes and no discharge, no lesions  Cervix: no lesions              Pap taken: No. Bimanual Exam:  Uterus:  normal size, contour, position, consistency, mobility, non-tender              Adnexa: normal adnexa and no mass, fullness, tenderness               Rectovaginal: Confirms               Anus:  normal sphincter tone, no lesions  Chaperone, Ina Homes, CMA, was present for exam.  Assessment/Plan: 1. Well woman exam with routine gynecological exam (Primary) - Pap smear neg with neg HR HPV 07/2021.  Guidelines reviewed. - Mammogram 08/2023 - Colonoscopy 2013.  Did cologuard in 2023.  Will repeat in 2026. - Bone mineral density 03/2022 - lab work done with PCP, Dr. Tracie Harrier - vaccines reviewed/updated  2. Postmenopausal - not on HRT  3. Primary hypertension - is following her blood pressures at home as antihypertensive may need adjustment.  Does have follow up with Dr. Tracie Harrier scheduled  4. OAB (overactive bladder) - has seen Dr. Florian Buff with urogynecology.  Cutting back caffeine has made a big  difference.  Currently does not need needs any other treatment at this time.

## 2024-04-05 ENCOUNTER — Other Ambulatory Visit: Payer: Self-pay | Admitting: Obstetrics & Gynecology

## 2024-04-05 DIAGNOSIS — Z1231 Encounter for screening mammogram for malignant neoplasm of breast: Secondary | ICD-10-CM

## 2024-05-17 ENCOUNTER — Ambulatory Visit

## 2024-05-24 ENCOUNTER — Telehealth (HOSPITAL_COMMUNITY): Payer: Self-pay | Admitting: Licensed Clinical Social Worker

## 2024-05-24 NOTE — Telephone Encounter (Signed)
 Patient called to schedule a new patient appointment for herself in regards to her husband who is a patient of Elsie Peter Garrot. Patient would like to know if she can be seen by Peter since her husband refused care. Patient is advised to go to Urgent Care or the ED in case of an emergency if needed.

## 2024-05-31 ENCOUNTER — Inpatient Hospital Stay
Admission: RE | Admit: 2024-05-31 | Discharge: 2024-05-31 | Discharge status: Home / Self Care | Source: Ambulatory Visit | Attending: Obstetrics & Gynecology | Admitting: Obstetrics & Gynecology

## 2024-05-31 DIAGNOSIS — Z1231 Encounter for screening mammogram for malignant neoplasm of breast: Secondary | ICD-10-CM

## 2024-06-06 ENCOUNTER — Other Ambulatory Visit: Payer: Self-pay | Admitting: Obstetrics & Gynecology

## 2024-06-06 DIAGNOSIS — R928 Other abnormal and inconclusive findings on diagnostic imaging of breast: Secondary | ICD-10-CM

## 2024-06-07 ENCOUNTER — Ambulatory Visit (HOSPITAL_BASED_OUTPATIENT_CLINIC_OR_DEPARTMENT_OTHER): Payer: Self-pay | Admitting: Obstetrics & Gynecology

## 2024-06-10 ENCOUNTER — Ambulatory Visit

## 2024-06-10 ENCOUNTER — Ambulatory Visit
Admission: RE | Admit: 2024-06-10 | Discharge: 2024-06-10 | Disposition: A | Discharge status: Home / Self Care | Source: Ambulatory Visit | Attending: Obstetrics & Gynecology | Admitting: Obstetrics & Gynecology

## 2024-06-10 DIAGNOSIS — R928 Other abnormal and inconclusive findings on diagnostic imaging of breast: Secondary | ICD-10-CM

## 2024-08-18 ENCOUNTER — Encounter (HOSPITAL_COMMUNITY): Payer: Self-pay

## 2024-08-18 ENCOUNTER — Ambulatory Visit (HOSPITAL_COMMUNITY): Payer: Self-pay | Admitting: Licensed Clinical Social Worker

## 2024-12-08 ENCOUNTER — Encounter (HOSPITAL_BASED_OUTPATIENT_CLINIC_OR_DEPARTMENT_OTHER): Payer: Self-pay | Admitting: Obstetrics & Gynecology

## 2024-12-08 ENCOUNTER — Ambulatory Visit (INDEPENDENT_AMBULATORY_CARE_PROVIDER_SITE_OTHER): Payer: Self-pay | Admitting: Obstetrics & Gynecology

## 2024-12-08 ENCOUNTER — Other Ambulatory Visit (HOSPITAL_COMMUNITY)
Admission: RE | Admit: 2024-12-08 | Discharge: 2024-12-08 | Disposition: A | Source: Ambulatory Visit | Attending: Obstetrics & Gynecology | Admitting: Obstetrics & Gynecology

## 2024-12-08 VITALS — BP 130/90 | HR 58 | Wt 164.0 lb

## 2024-12-08 DIAGNOSIS — Z01419 Encounter for gynecological examination (general) (routine) without abnormal findings: Secondary | ICD-10-CM

## 2024-12-08 DIAGNOSIS — N3281 Overactive bladder: Secondary | ICD-10-CM | POA: Diagnosis not present

## 2024-12-08 DIAGNOSIS — Z124 Encounter for screening for malignant neoplasm of cervix: Secondary | ICD-10-CM

## 2024-12-08 DIAGNOSIS — Z1331 Encounter for screening for depression: Secondary | ICD-10-CM

## 2024-12-08 DIAGNOSIS — I10 Essential (primary) hypertension: Secondary | ICD-10-CM | POA: Diagnosis not present

## 2024-12-08 NOTE — Progress Notes (Signed)
 "  ANNUAL EXAM Patient name: Candace [name] MRN [id number]  Date of birth: 1956-10-06 Chief Complaint:   Gynecologic Exam  History of Present Illness:   Candace [name] is a 69 y.o. [phone] Caucasian female being seen today for a routine annual exam.  Denies vaginal bleeding.  She continues to have urinary incontinence.  In particular, stimulants were decreased and last year she felt this really helped. Not as sure now.  Did have referral to urogyn.  Has been to PT.  Would like to go back.    Husband passed this summer.  This was sudden.  Seeing a veterinary surgeon.  Sister has been very helpful during this time.    Would like to exercise more.  Feels could be beneficial to have some guidance.  Fitness assessment with [facility] discussed.  Pt would like to proceed with this.    Patient's last menstrual period was 08/10/2014.  Last pap: 07/31/2021. Results were: NILM w/ HRHPV negative. H/O abnormal pap: yes Last mammogram: 06/10/2024 Diagnostic. Results were: normal. Family h/o breast cancer: no Last colonoscopy: 08/24/2022 Cologuard. Results were: normal. Family h/o colorectal cancer: no Dexa:  2023  normal.        12/08/2024    3:52 PM 12/08/2023    4:15 PM 08/13/2022    9:59 AM 07/31/2021    2:56 PM  Depression screen PHQ 2/9  Decreased Interest 0 0 0 0  Down, Depressed, Hopeless 1 0 0 0  PHQ - 2 Score 1 0 0 0  Altered sleeping 1     Tired, decreased energy 1     Change in appetite 1     Feeling bad or failure about yourself  0     Trouble concentrating 0     Moving slowly or fidgety/restless 0     Suicidal thoughts 0     PHQ-9 Score 4        Review of Systems:   Pertinent items are noted in HPI Denies any urinary or bowel changes.  Still having urinary incontinence.  Denies pelvic pain   Pertinent History Reviewed:  Reviewed past medical,surgical, social and family history.  Reviewed problem list, medications and allergies. Physical Assessment:   Vitals:   12/08/24  1548  BP: (!) 130/90  Pulse: (!) 58  SpO2: 100%  Weight: 164 lb (74.4 kg)  Body mass index is 29.05 kg/m.        Physical Examination:   General appearance - well appearing, and in no distress  Mental status - alert, oriented to person, place, and time  Psych:  She has a normal mood and affect  Skin - warm and dry, normal color, no suspicious lesions noted  Chest - effort normal, all lung fields clear to auscultation bilaterally  Heart - normal rate and regular rhythm  Neck:  midline trachea, no thyromegaly or nodules  Breasts - breasts appear normal, no suspicious masses, no skin or nipple changes or  axillary nodes  Abdomen - soft, nontender, nondistended, no masses or organomegaly  Pelvic - VULVA: normal appearing vulva with no masses, tenderness or lesions   VAGINA: normal appearing vagina with normal color and discharge, no lesions   CERVIX: normal appearing cervix without discharge or lesions, no CMT  Thin prep pap is obtained  UTERUS: uterus is felt to be normal size, shape, consistency and nontender   ADNEXA: No adnexal masses or tenderness noted.  Rectal - normal rectal, good sphincter tone, no masses felt.   Extremities:  No swelling or varicosities noted  Chaperone present for exam  No results found for this or any previous visit (from the past 24 hours).  Assessment & Plan:  1. Well woman exam with routine gynecological exam (Primary) - Pap smear update today - Mammogram 06/10/2024 - Colonoscopy 08/24/2022 cologuard - Bone mineral density 2023 - lab work done with PCP, Dr. Rolinda - vaccines reviewed/updated  2. Cervical cancer screening - Cytology - PAP( [place])  3. OAB (overactive bladder) - Ambulatory referral to Physical Therapy  4. Primary hypertension - Ambulatory referral to [facility]   Orders Placed This Encounter  Procedures   Ambulatory referral to [facility]   Ambulatory referral to Physical  Therapy    Meds: No orders of the defined types were placed in this encounter.   Follow-up: Return in about 1 year (around 12/08/2025).  Ronal GORMAN Pinal, MD 12/11/2024 6:21 PM "

## 2024-12-11 ENCOUNTER — Encounter (HOSPITAL_BASED_OUTPATIENT_CLINIC_OR_DEPARTMENT_OTHER): Payer: Self-pay | Admitting: Obstetrics & Gynecology

## 2024-12-12 LAB — CYTOLOGY - PAP
Comment: NEGATIVE
Diagnosis: NEGATIVE
High risk HPV: NEGATIVE

## 2024-12-13 ENCOUNTER — Ambulatory Visit (HOSPITAL_BASED_OUTPATIENT_CLINIC_OR_DEPARTMENT_OTHER): Payer: Self-pay | Admitting: Obstetrics & Gynecology
